# Patient Record
Sex: Female | Born: 1945
Health system: Southern US, Community
[De-identification: ages and names within clinical notes are randomized; demographics above are authoritative.]

## PROBLEM LIST (undated history)

## (undated) ENCOUNTER — Inpatient Hospital Stay: Admission: EM | Payer: Self-pay | Source: Home / Self Care

## (undated) DIAGNOSIS — M109 Gout, unspecified: Secondary | ICD-10-CM

## (undated) DIAGNOSIS — R7303 Prediabetes: Secondary | ICD-10-CM

## (undated) DIAGNOSIS — K52839 Microscopic colitis, unspecified: Secondary | ICD-10-CM

## (undated) DIAGNOSIS — E785 Hyperlipidemia, unspecified: Secondary | ICD-10-CM

## (undated) DIAGNOSIS — I10 Essential (primary) hypertension: Secondary | ICD-10-CM

## (undated) HISTORY — DX: Hyperlipidemia, unspecified: E78.5

## (undated) HISTORY — DX: Microscopic colitis, unspecified: K52.839

## (undated) HISTORY — PX: CHOLECYSTECTOMY: SHX55

## (undated) HISTORY — DX: Prediabetes: R73.03

## (undated) HISTORY — DX: Gout, unspecified: M10.9

## (undated) HISTORY — PX: CYST EXCISION: SHX5701

## (undated) HISTORY — PX: OVARIAN CYST SURGERY: SHX726

## (undated) HISTORY — PX: TONSILLECTOMY: SUR1361

---

## 1997-09-19 ENCOUNTER — Other Ambulatory Visit: Admission: RE | Admit: 1997-09-19 | Discharge: 1997-09-19 | Payer: Self-pay | Admitting: *Deleted

## 2001-04-07 ENCOUNTER — Other Ambulatory Visit: Admission: RE | Admit: 2001-04-07 | Discharge: 2001-04-07 | Payer: Self-pay | Admitting: Internal Medicine

## 2002-03-09 ENCOUNTER — Encounter: Admission: RE | Admit: 2002-03-09 | Discharge: 2002-03-09 | Payer: Self-pay | Admitting: Internal Medicine

## 2002-03-09 ENCOUNTER — Encounter: Payer: Self-pay | Admitting: Internal Medicine

## 2002-04-25 ENCOUNTER — Encounter (INDEPENDENT_AMBULATORY_CARE_PROVIDER_SITE_OTHER): Payer: Self-pay

## 2002-04-25 ENCOUNTER — Ambulatory Visit (HOSPITAL_COMMUNITY): Admission: RE | Admit: 2002-04-25 | Discharge: 2002-04-25 | Payer: Self-pay | Admitting: *Deleted

## 2004-04-12 ENCOUNTER — Other Ambulatory Visit: Admission: RE | Admit: 2004-04-12 | Discharge: 2004-04-12 | Payer: Self-pay | Admitting: Internal Medicine

## 2004-06-06 ENCOUNTER — Emergency Department (HOSPITAL_COMMUNITY): Admission: EM | Admit: 2004-06-06 | Discharge: 2004-06-06 | Payer: Self-pay | Admitting: Emergency Medicine

## 2005-12-22 ENCOUNTER — Encounter (INDEPENDENT_AMBULATORY_CARE_PROVIDER_SITE_OTHER): Payer: Self-pay | Admitting: Specialist

## 2005-12-22 ENCOUNTER — Ambulatory Visit (HOSPITAL_BASED_OUTPATIENT_CLINIC_OR_DEPARTMENT_OTHER): Admission: RE | Admit: 2005-12-22 | Discharge: 2005-12-22 | Payer: Self-pay | Admitting: *Deleted

## 2006-01-03 IMAGING — CR DG HIP COMPLETE 2+V*R*
3 series · 3 of 3 positions shown · non-contrast
Comparison: none

CLINICAL DATA: The patient fell down a flight of stairs this morning and has pain radiating from the right hip to the right ankle. 
 RIGHT HIP ? THREE VIEW:

  There is no evidence of fracture or dislocation. No other significant bone or soft tissue abnormalities are identified. The joint spaces are within normal limits.

[view not recorded (1 of 3)]
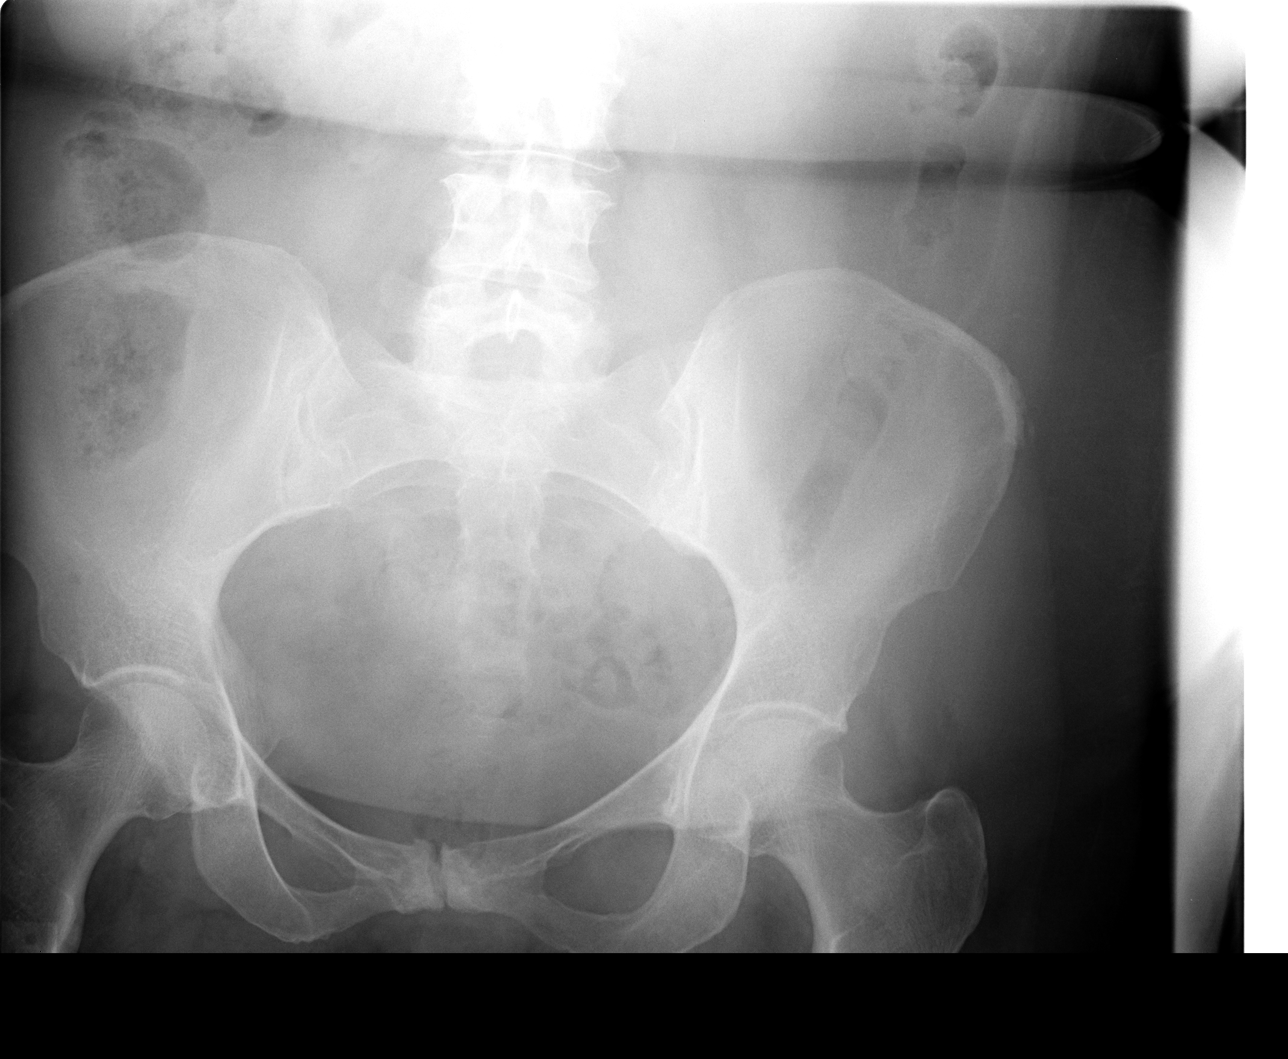

[view not recorded (2 of 3)]
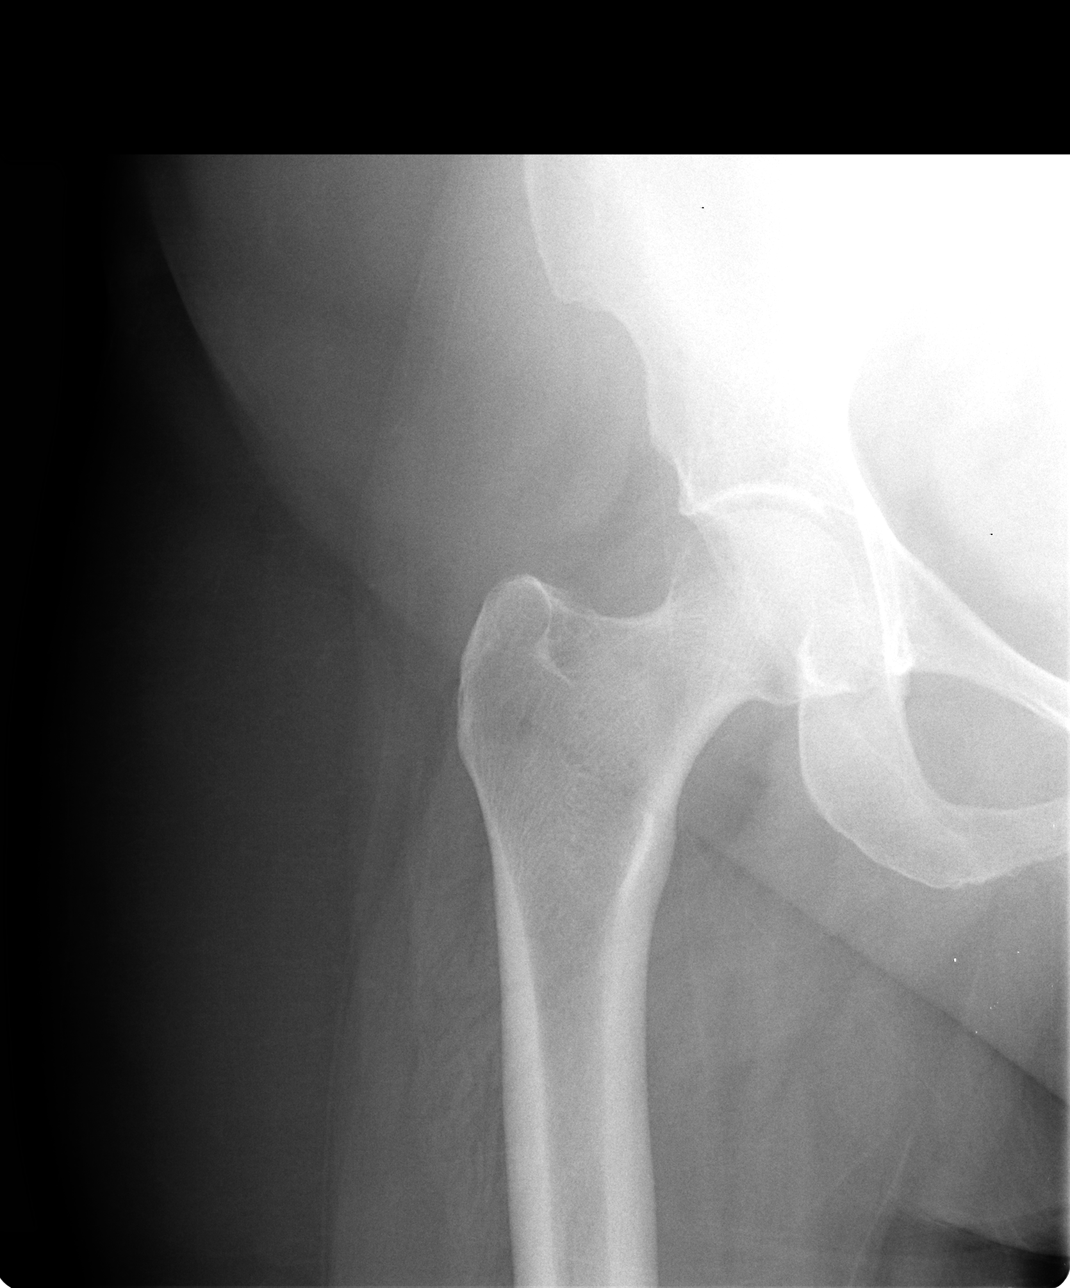

[view not recorded (3 of 3)]
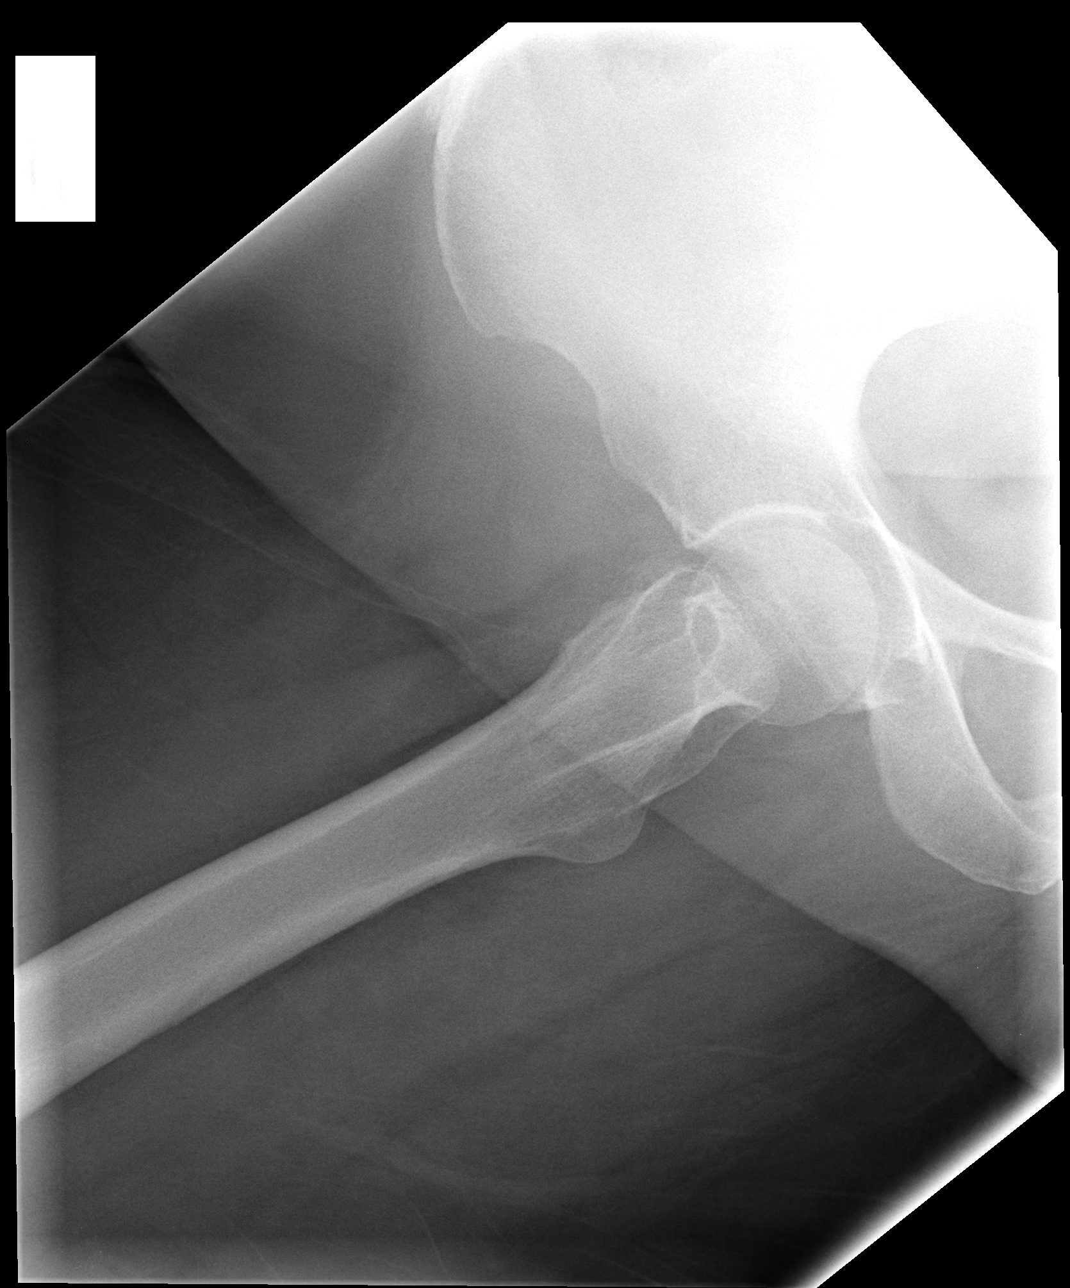

[3 of 3 positions shown; findings below may reference images not displayed]

IMPRESSION: Normal study. 
 RIGHT KNEE ? FOUR VIEW:
 There is no fracture, dislocation, or joint effusion. There is moderate degenerative arthritis of the patellofemoral compartment and to a lesser degree in the medial and lateral compartments. No acute bony abnormality.
IMPRESSION: No acute abnormality.
 RIGHT ANKLE ? THREE VIEW:
 There is no fracture, dislocation, or joint effusion.  No acute bony abnormality.  There are moderate degenerative changes of the ankle joint, particularly at the tip of the medial malleolus where there are some irregular spurs and evidence of old avulsions.
IMPRESSION: No acute abnormality.  Degenerative arthritic changes at the ankle joint.  
 RIGHT TIBIA AND FIBULA:
 There is no fracture, dislocation, or other significant abnormality.
IMPRESSION: Normal right tibia and fibula.

## 2006-01-03 IMAGING — CR DG KNEE COMPLETE 4+V*R*
4 series · 4 of 4 positions shown · non-contrast
Comparison: none

CLINICAL DATA: The patient fell down a flight of stairs this morning and has pain radiating from the right hip to the right ankle. 
 RIGHT HIP ? THREE VIEW:

  There is no evidence of fracture or dislocation. No other significant bone or soft tissue abnormalities are identified. The joint spaces are within normal limits.

[view not recorded (1 of 4)]
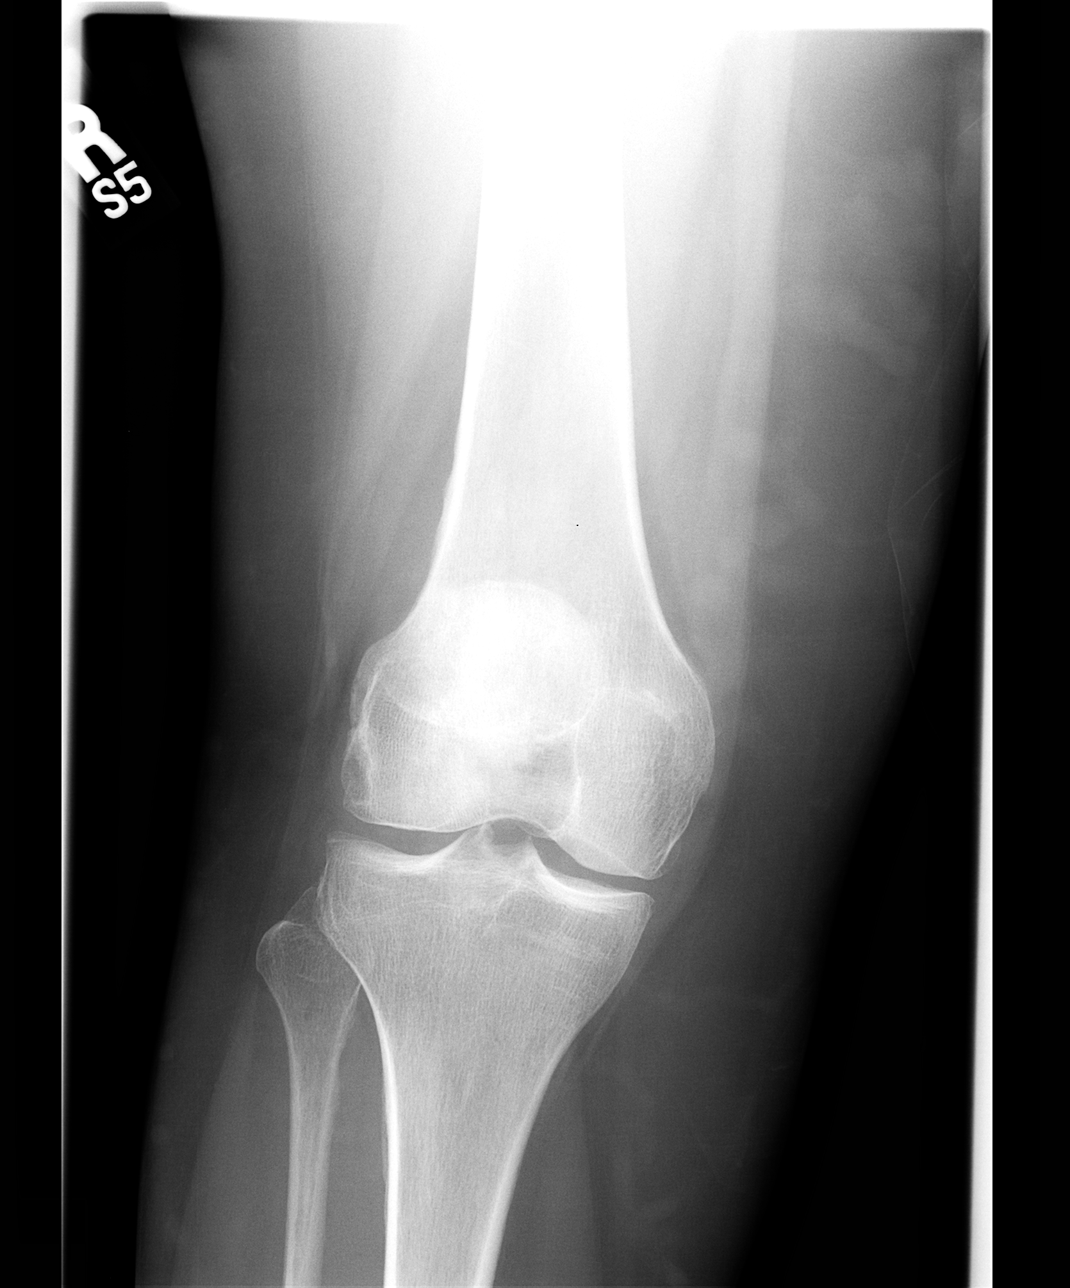

[view not recorded (2 of 4)]
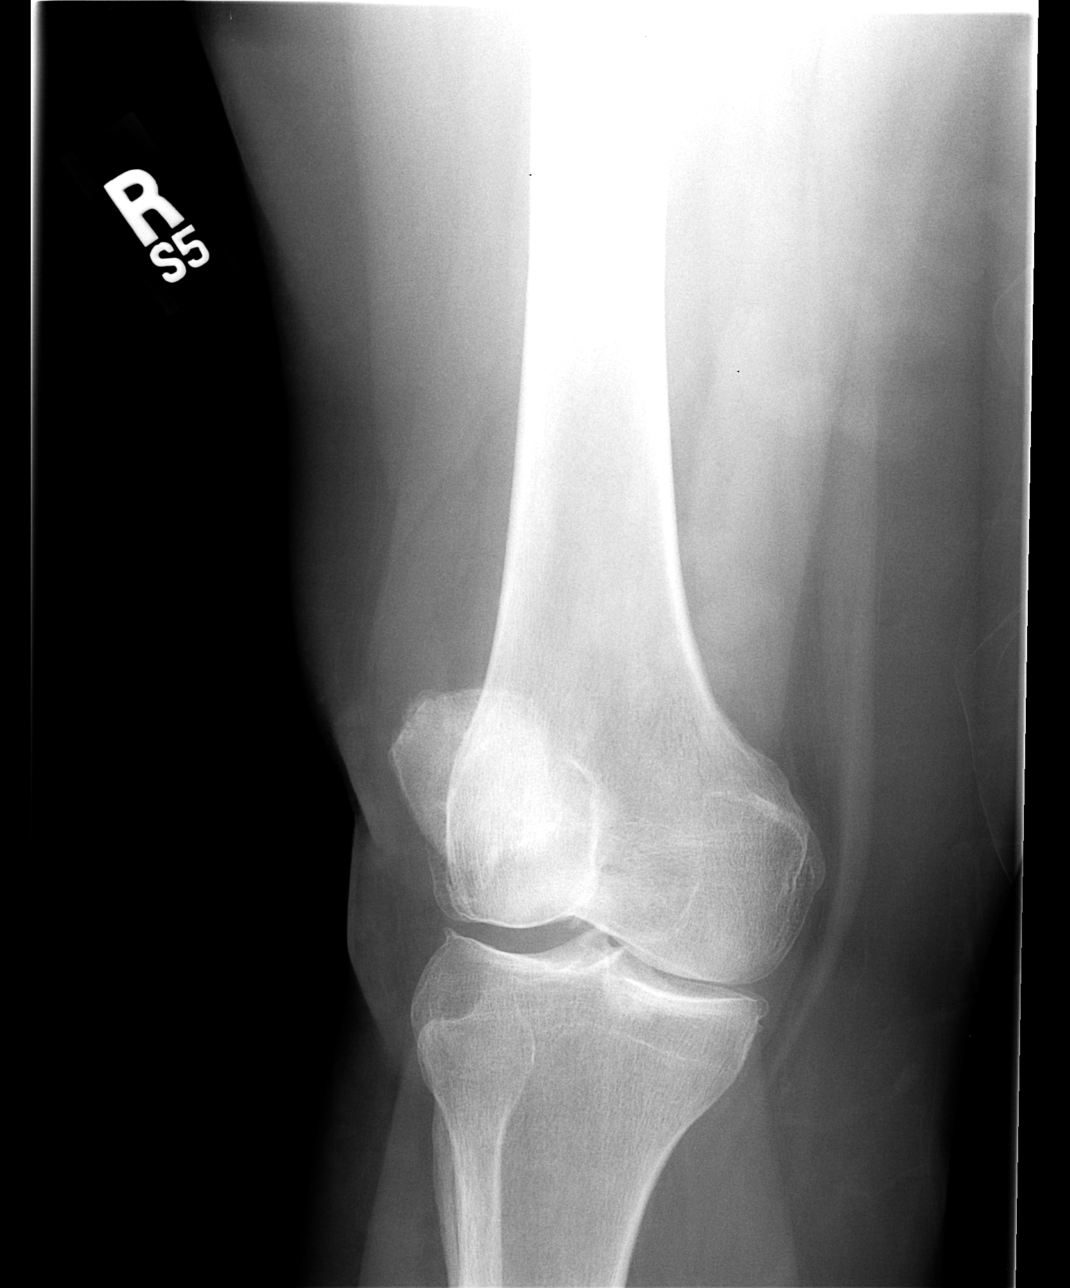

[view not recorded (3 of 4)]
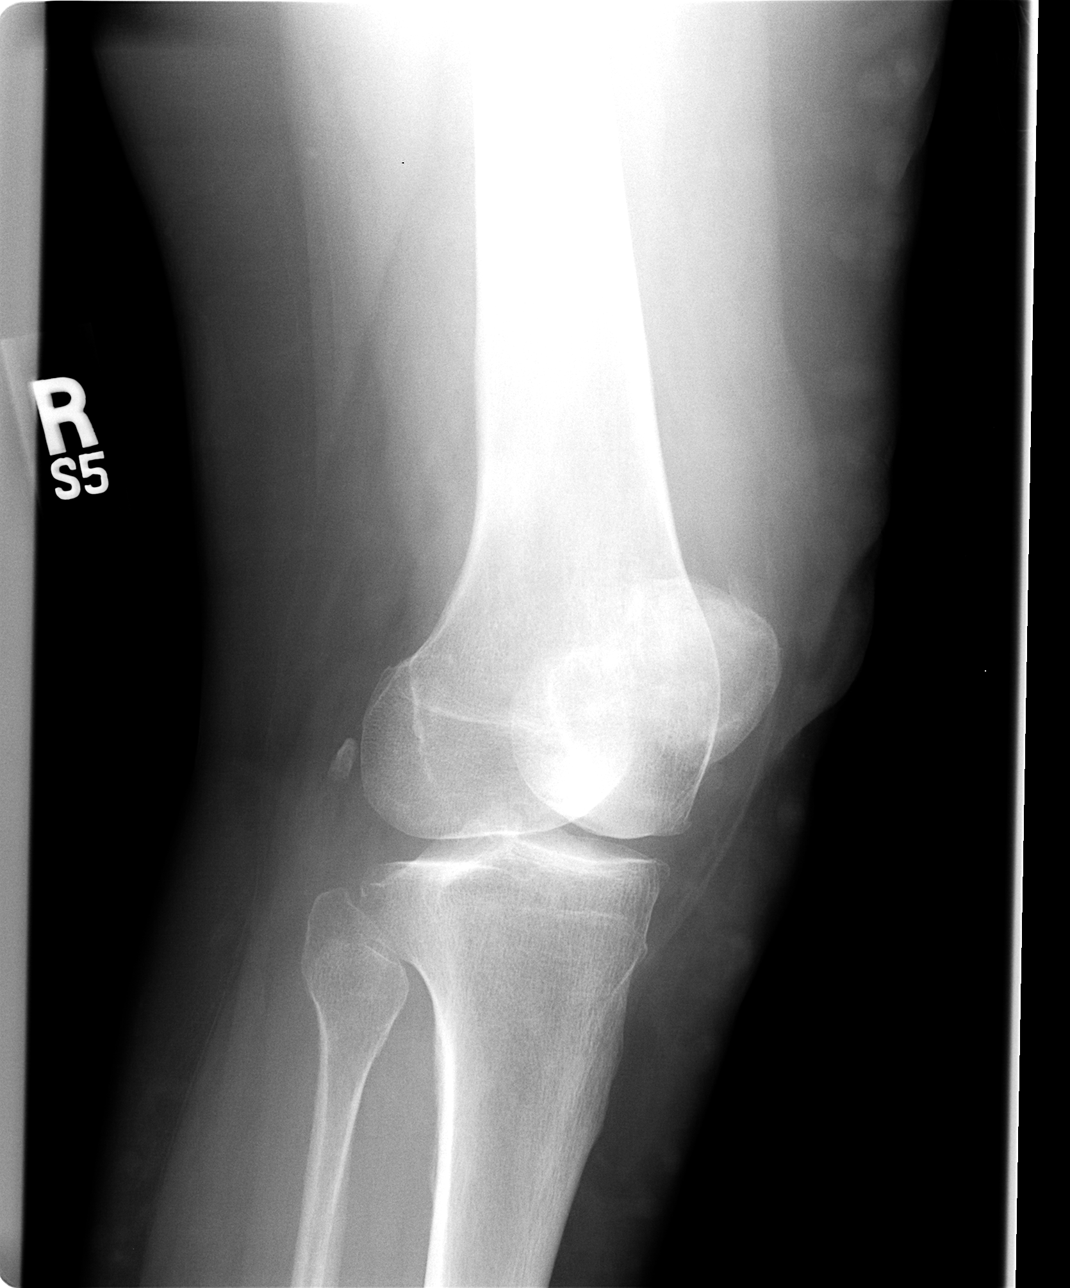

[view not recorded (4 of 4)]
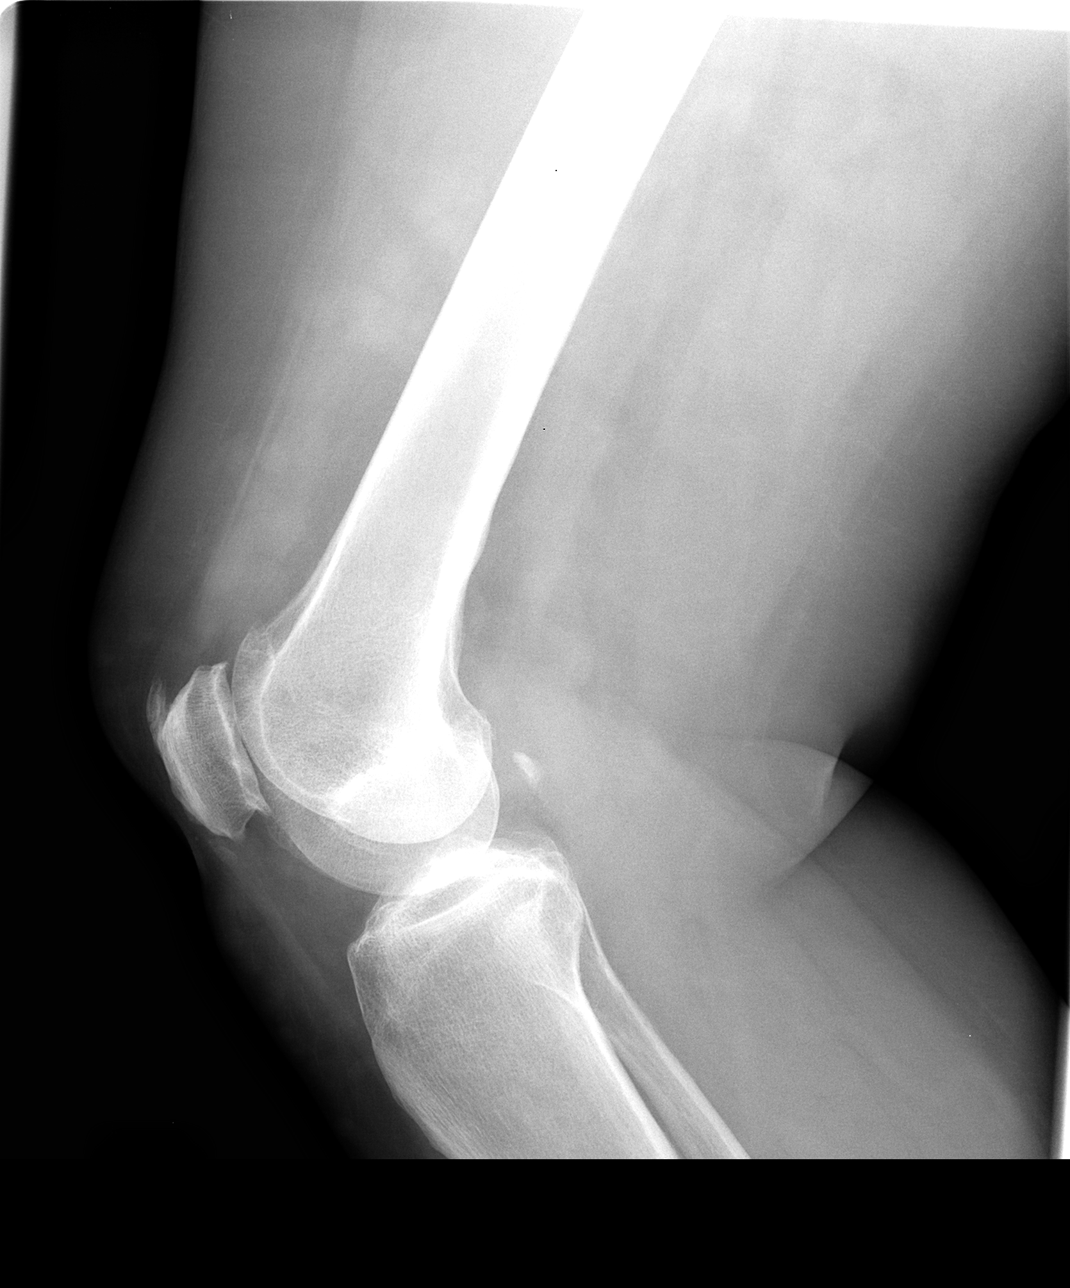

[4 of 4 positions shown; findings below may reference images not displayed]

IMPRESSION: Normal study. 
 RIGHT KNEE ? FOUR VIEW:
 There is no fracture, dislocation, or joint effusion. There is moderate degenerative arthritis of the patellofemoral compartment and to a lesser degree in the medial and lateral compartments. No acute bony abnormality.
IMPRESSION: No acute abnormality.
 RIGHT ANKLE ? THREE VIEW:
 There is no fracture, dislocation, or joint effusion.  No acute bony abnormality.  There are moderate degenerative changes of the ankle joint, particularly at the tip of the medial malleolus where there are some irregular spurs and evidence of old avulsions.
IMPRESSION: No acute abnormality.  Degenerative arthritic changes at the ankle joint.  
 RIGHT TIBIA AND FIBULA:
 There is no fracture, dislocation, or other significant abnormality.
IMPRESSION: Normal right tibia and fibula.

## 2006-01-03 IMAGING — CR DG ANKLE COMPLETE 3+V*R*
2 series · 2 of 2 positions shown · non-contrast
Comparison: none

CLINICAL DATA: The patient fell down a flight of stairs this morning and has pain radiating from the right hip to the right ankle. 
 RIGHT HIP ? THREE VIEW:

  There is no evidence of fracture or dislocation. No other significant bone or soft tissue abnormalities are identified. The joint spaces are within normal limits.

[view not recorded (1 of 2)]
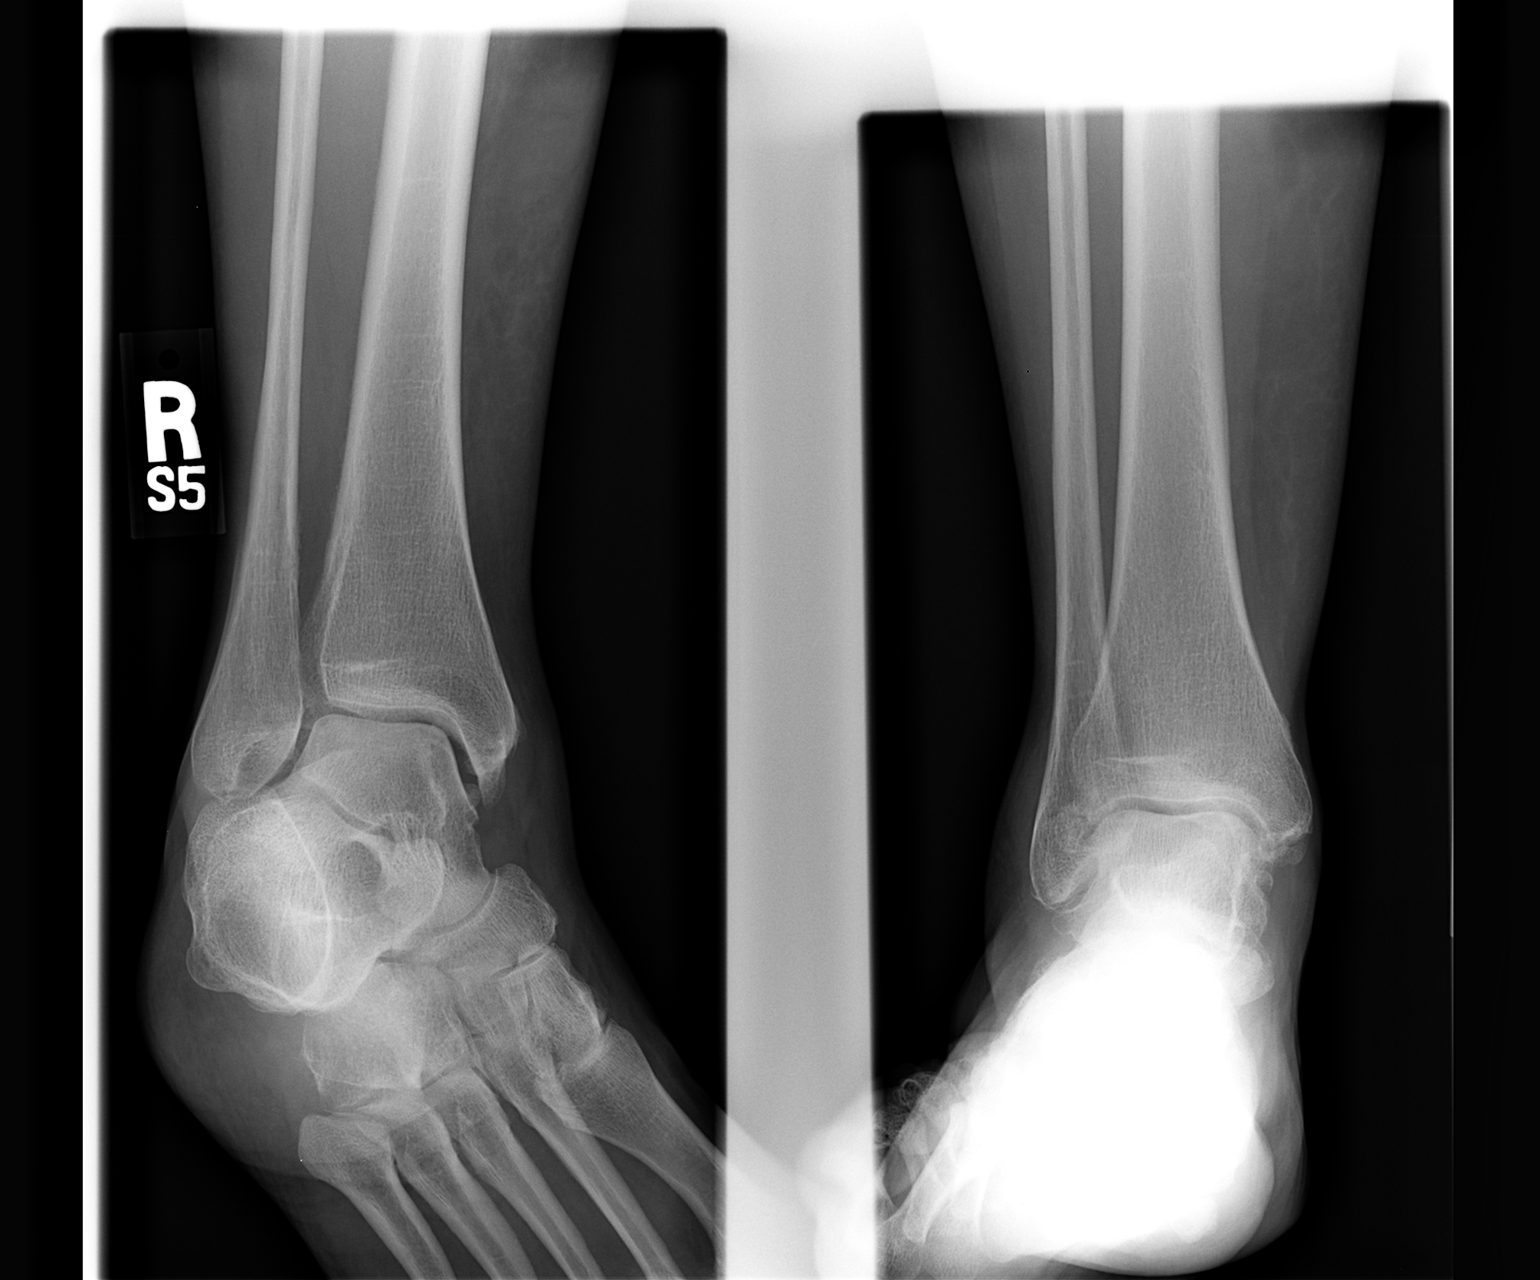

[view not recorded (2 of 2)]
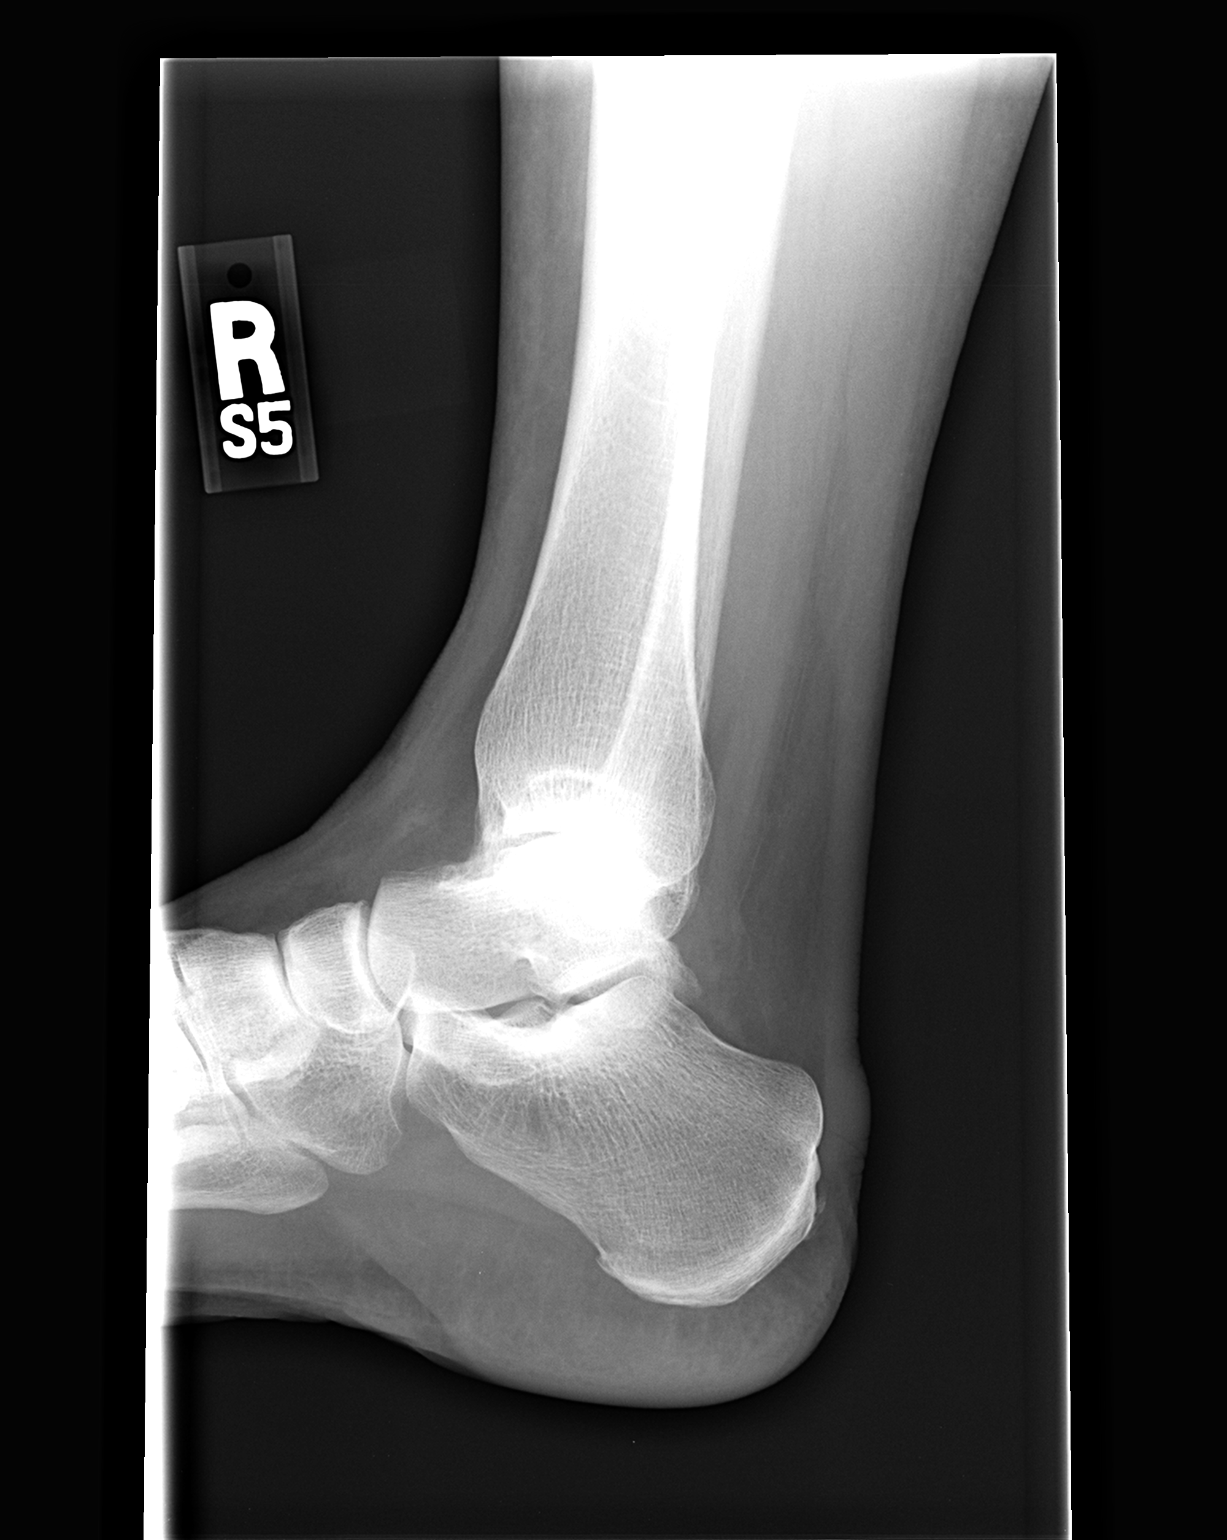

[2 of 2 positions shown; findings below may reference images not displayed]

IMPRESSION: Normal study. 
 RIGHT KNEE ? FOUR VIEW:
 There is no fracture, dislocation, or joint effusion. There is moderate degenerative arthritis of the patellofemoral compartment and to a lesser degree in the medial and lateral compartments. No acute bony abnormality.
IMPRESSION: No acute abnormality.
 RIGHT ANKLE ? THREE VIEW:
 There is no fracture, dislocation, or joint effusion.  No acute bony abnormality.  There are moderate degenerative changes of the ankle joint, particularly at the tip of the medial malleolus where there are some irregular spurs and evidence of old avulsions.
IMPRESSION: No acute abnormality.  Degenerative arthritic changes at the ankle joint.  
 RIGHT TIBIA AND FIBULA:
 There is no fracture, dislocation, or other significant abnormality.
IMPRESSION: Normal right tibia and fibula.

## 2006-07-20 ENCOUNTER — Encounter: Admission: RE | Admit: 2006-07-20 | Discharge: 2006-07-20 | Payer: Self-pay | Admitting: Occupational Medicine

## 2010-02-08 ENCOUNTER — Encounter: Admission: RE | Admit: 2010-02-08 | Discharge: 2010-02-08 | Payer: Self-pay | Admitting: Internal Medicine

## 2010-04-05 ENCOUNTER — Ambulatory Visit (HOSPITAL_COMMUNITY): Admission: RE | Admit: 2010-04-05 | Discharge: 2010-04-05 | Payer: Self-pay | Admitting: Surgery

## 2010-09-05 LAB — COMPREHENSIVE METABOLIC PANEL
ALT: 16 U/L (ref 0–35)
AST: 19 U/L (ref 0–37)
Albumin: 4.1 g/dL (ref 3.5–5.2)
Alkaline Phosphatase: 63 U/L (ref 39–117)
BUN: 20 mg/dL (ref 6–23)
CO2: 27 mEq/L (ref 19–32)
Calcium: 9.1 mg/dL (ref 8.4–10.5)
Chloride: 104 mEq/L (ref 96–112)
Creatinine, Ser: 0.8 mg/dL (ref 0.4–1.2)
GFR calc Af Amer: >60 mL/min (ref >60)
GFR calc non Af Amer: >60 mL/min (ref >60)
Glucose, Bld: 100 mg/dL — ABNORMAL HIGH (ref 70–99)
Potassium: 3.7 mEq/L (ref 3.5–5.1)
Sodium: 137 mEq/L (ref 135–145)
Total Bilirubin: 0.6 mg/dL (ref 0.3–1.2)
Total Protein: 6.9 g/dL (ref 6.0–8.3)

## 2010-09-05 LAB — CBC
HCT: 39.5 % (ref 36.0–46.0)
Hemoglobin: 13.6 g/dL (ref 12.0–15.0)
MCH: 30.8 pg (ref 26.0–34.0)
MCHC: 34.5 g/dL (ref 30.0–36.0)
MCV: 89.2 fL (ref 78.0–100.0)
Platelets: 243 10*3/uL (ref 150–400)
RBC: 4.43 MIL/uL (ref 3.87–5.11)
RDW: 13.7 % (ref 11.5–15.5)
WBC: 6.9 10*3/uL (ref 4.0–10.5)

## 2010-09-05 LAB — SURGICAL PCR SCREEN
MRSA, PCR: NEGATIVE
Staphylococcus aureus: POSITIVE — AB

## 2010-11-08 NOTE — Op Note (Signed)
Shelly Yates, Shelly Yates                  ACCOUNT NO.:  [id number]   MEDICAL RECORD NO.:  [id number]          PATIENT TYPE:  AMB   LOCATION:  [facility]                         FACILITY:  [facility]   PHYSICIAN:  Alfonse Ras, MD   DATE OF BIRTH:  1945-09-14   DATE OF PROCEDURE:  12/22/2005  DATE OF DISCHARGE:                                 OPERATIVE REPORT   PREOPERATIVE DIAGNOSIS:  Right chest lipoma.   POSTOPERATIVE DIAGNOSIS:  Right chest lipoma.   PROCEDURE:  Excision of right chest wall lipoma.   ANESTHESIA:  MAC.   SURGEON:  Alfonse Ras, MD   DESCRIPTION OF PROCEDURE:  The patient was taken to the operating room and  placed in the supine position.  After adequate MAC anesthesia was induced,  she was placed in the left lateral decubitus position.  The right arm was  extended superiorly.  The area of the palpable lipoma was anesthetized after  the area had been prepped and draped in the normal sterile fashion with 1%  lidocaine with bicarb.   A transverse incision was made over the palpable mass and dissected down  through the subcutaneous fat onto a well-encapsulated lipoma which easily  delivered itself up through the wound.  It was excised in its entirety.  Adequate hemostasis was assured.  The skin was closed with subcuticular 3-0  Monocryl.  Steri-Strips and sterile dressings were applied.   The patient tolerated the procedure well and went to the PACU in good  condition.      Alfonse Ras, MD  Electronically Signed     KRE/MEDQ  D:  12/22/2005  T:  12/22/2005  Job:  [id number]

## 2010-11-08 NOTE — Op Note (Signed)
   NAME:  Shelly Yates, Shelly Yates                            ACCOUNT NO.:  [id number]   MEDICAL RECORD NO.:  [id number]                   PATIENT TYPE:  AMB   LOCATION:  ENDO                                 FACILITY:  [facility]   PHYSICIAN:  Georgiana Spinner, M.D.                 DATE OF BIRTH:  [birth year]   DATE OF PROCEDURE:  DATE OF DISCHARGE:                                 OPERATIVE REPORT   PROCEDURE:  Colonoscopy.   INDICATIONS:  Rectal bleeding.   ANESTHESIA:  Demerol 20, Versed 1.5 mg.   DESCRIPTION OF PROCEDURE:  With the patient mildly sedated in the left  lateral decubitus position, the Olympus videoscopic colonoscope was inserted  in the rectum and passed under direct vision to the cecum, identified by the  ileocecal valve and appendiceal orifice, both of which were photographed.  From this point, the colonoscope was slowly withdrawn, taking  circumferential views of the entire colonic mucosa, stopping to photograph  mild to moderate diverticulosis seen in the sigmoid colon, until we reached  the rectum. which appeared normal on direct and showed hemorrhoids on  retroflexed view.  The endoscope was straightened and withdrawn.  The  patient's vital signs and pulse oximeter remained stable.  The patient  tolerated the procedure well without apparent complications.   FINDINGS:  1. Internal hemorrhoids.  2. Diverticulosis of the sigmoid colon.  3. Otherwise unremarkable exam.   PLAN:  See endoscopy note for further details.                                               Georgiana Spinner, M.D.    GMO/MEDQ  D:  04/25/2002  T:  04/25/2002  Job:  [id number]

## 2010-11-08 NOTE — Op Note (Signed)
   NAMELUNABELLA, BADGETT                              ACCOUNT NO.:  [id number]   MEDICAL RECORD NO.:  [id number]                   PATIENT TYPE:   LOCATION:                                       FACILITY:   PHYSICIAN:  Georgiana Spinner, M.D.                 DATE OF BIRTH:   DATE OF PROCEDURE:  DATE OF DISCHARGE:                                 OPERATIVE REPORT   PROCEDURE:  Upper endoscopy.   INDICATIONS:  Abdominal pain, reflux.   ANESTHESIA:  Versed 7 mg, Demerol 55 mg.   DESCRIPTION OF PROCEDURE:  With patient mildly sedated in the left lateral  decubitus position, the Olympus videoscopic endoscope was inserted in the  mouth, passed under direct vision through the esophagus, which appeared  normal, into the stomach.  Fundus, body appeared normal.  Antrum showed some  very mild antritis which was photographed and biopsied.  We entered the  duodenal bulb which showed mild duodenitis.  Second portion of  duodenum  appeared normal.  From this point, the endoscope was slowly withdrawn,  taking circumferential views of the duodenal mucosa until the endoscope then  pulled back into the stomach, placed in retroflexion to view the stomach  from below.  The endoscope was then straightened and withdrawn, taking  circumferential views of the remaining gastric and esophageal mucosa.  The  patient's vital signs and pulse oximeter remained stable.  The patient  tolerated the procedure well without apparent complications.   FINDINGS:  Changes as mentioned above of antritis, mild, and duodenitis,  mild.   PLAN:  1. Await biopsy report.  2. The patient will call me for results and follow up with me as an     outpatient.  3. Proceed to colonoscopy as planned.                                               Georgiana Spinner, M.D.    GMO/MEDQ  D:  04/25/2002  T:  04/25/2002  Job:  [id number]

## 2012-03-30 ENCOUNTER — Other Ambulatory Visit (HOSPITAL_COMMUNITY)
Admission: RE | Admit: 2012-03-30 | Discharge: 2012-03-30 | Disposition: A | Discharge status: Home / Self Care | Payer: BC Managed Care – PPO | Source: Ambulatory Visit | Attending: Internal Medicine | Admitting: Internal Medicine

## 2012-03-30 ENCOUNTER — Other Ambulatory Visit: Payer: Self-pay | Admitting: Registered Nurse

## 2012-03-30 DIAGNOSIS — Z124 Encounter for screening for malignant neoplasm of cervix: Secondary | ICD-10-CM | POA: Insufficient documentation

## 2012-07-03 ENCOUNTER — Emergency Department (HOSPITAL_COMMUNITY)
Admission: EM | Admit: 2012-07-03 | Discharge: 2012-07-03 | Disposition: A | Discharge status: Home / Self Care | Payer: BC Managed Care – PPO | Attending: Emergency Medicine | Admitting: Emergency Medicine

## 2012-07-03 ENCOUNTER — Emergency Department (HOSPITAL_COMMUNITY): Payer: BC Managed Care – PPO

## 2012-07-03 ENCOUNTER — Encounter (HOSPITAL_COMMUNITY): Payer: Self-pay | Admitting: Emergency Medicine

## 2012-07-03 DIAGNOSIS — I10 Essential (primary) hypertension: Secondary | ICD-10-CM | POA: Insufficient documentation

## 2012-07-03 DIAGNOSIS — K5732 Diverticulitis of large intestine without perforation or abscess without bleeding: Secondary | ICD-10-CM | POA: Insufficient documentation

## 2012-07-03 DIAGNOSIS — Z79899 Other long term (current) drug therapy: Secondary | ICD-10-CM | POA: Insufficient documentation

## 2012-07-03 DIAGNOSIS — Z7982 Long term (current) use of aspirin: Secondary | ICD-10-CM | POA: Insufficient documentation

## 2012-07-03 DIAGNOSIS — K5792 Diverticulitis of intestine, part unspecified, without perforation or abscess without bleeding: Secondary | ICD-10-CM

## 2012-07-03 HISTORY — DX: Essential (primary) hypertension: I10

## 2012-07-03 LAB — URINALYSIS, MICROSCOPIC ONLY
Bilirubin Urine: NEGATIVE
Hgb urine dipstick: NEGATIVE
Ketones, ur: NEGATIVE mg/dL
Leukocytes, UA: NEGATIVE
Nitrite: NEGATIVE
Protein, ur: NEGATIVE mg/dL
Specific Gravity, Urine: 1.014 (ref 1.005–1.030)
Urobilinogen, UA: 0.2 mg/dL (ref 0.0–1.0)
pH: 6 (ref 5.0–8.0)

## 2012-07-03 LAB — COMPREHENSIVE METABOLIC PANEL
ALT: 15 U/L (ref 0–35)
AST: 16 U/L (ref 0–37)
Alkaline Phosphatase: 77 U/L (ref 39–117)
BUN: 15 mg/dL (ref 6–23)
CO2: 27 mEq/L (ref 19–32)
Calcium: 10.2 mg/dL (ref 8.4–10.5)
Chloride: 96 mEq/L (ref 96–112)
Creatinine, Ser: 0.67 mg/dL (ref 0.50–1.10)
GFR calc Af Amer: >90 mL/min (ref >90)
GFR calc non Af Amer: >90 mL/min (ref >90)
Glucose, Bld: 111 mg/dL — ABNORMAL HIGH (ref 70–99)
Potassium: 3.8 mEq/L (ref 3.5–5.1)
Total Bilirubin: 0.5 mg/dL (ref 0.3–1.2)
Total Protein: 7.8 g/dL (ref 6.0–8.3)

## 2012-07-03 LAB — CBC WITH DIFFERENTIAL/PLATELET
Basophils Relative: 0 % (ref 0–1)
Eosinophils Absolute: 0.1 10*3/uL (ref 0.0–0.7)
Eosinophils Relative: 1 % (ref 0–5)
HCT: 42 % (ref 36.0–46.0)
Hemoglobin: 14.7 g/dL (ref 12.0–15.0)
Lymphocytes Relative: 19 % (ref 12–46)
Lymphs Abs: 2.1 10*3/uL (ref 0.7–4.0)
MCH: 30.4 pg (ref 26.0–34.0)
MCHC: 35 g/dL (ref 30.0–36.0)
MCV: 87 fL (ref 78.0–100.0)
Monocytes Absolute: 0.5 10*3/uL (ref 0.1–1.0)
Monocytes Relative: 5 % (ref 3–12)
Neutro Abs: 8.3 10*3/uL — ABNORMAL HIGH (ref 1.7–7.7)
Neutrophils Relative %: 76 % (ref 43–77)
Platelets: 296 10*3/uL (ref 150–400)
RBC: 4.83 MIL/uL (ref 3.87–5.11)
RDW: 13 % (ref 11.5–15.5)
WBC: 11 10*3/uL — ABNORMAL HIGH (ref 4.0–10.5)

## 2012-07-03 LAB — LIPASE, BLOOD: Lipase: 32 U/L (ref 11–59)

## 2012-07-03 MED ORDER — ONDANSETRON HCL 8 MG PO TABS: 8.0000 mg | ORAL_TABLET | Freq: Three times a day (TID) | ORAL | Status: DC | PRN

## 2012-07-03 MED ORDER — HYDROCODONE-ACETAMINOPHEN 5-325 MG PO TABS: 1.0000 | ORAL_TABLET | Freq: Four times a day (QID) | ORAL | Status: DC | PRN

## 2012-07-03 MED ORDER — METRONIDAZOLE 500 MG PO TABS: 500.0000 mg | ORAL_TABLET | Freq: Four times a day (QID) | ORAL | Status: DC

## 2012-07-03 MED ORDER — ONDANSETRON HCL 4 MG/2ML IJ SOLN
4.0000 mg | Freq: Once | INTRAMUSCULAR | Status: AC
  Administered 2012-07-03: 4 mg via INTRAVENOUS
  Filled 2012-07-03: qty 2

## 2012-07-03 MED ORDER — CIPROFLOXACIN HCL 500 MG PO TABS: 500.0000 mg | ORAL_TABLET | Freq: Two times a day (BID) | ORAL | Status: DC

## 2012-07-03 MED ORDER — HYDROMORPHONE HCL PF 1 MG/ML IJ SOLN
1.0000 mg | Freq: Once | INTRAMUSCULAR | Status: AC
  Administered 2012-07-03: 1 mg via INTRAVENOUS
  Filled 2012-07-03: qty 1

## 2012-07-03 MED ORDER — CIPROFLOXACIN IN D5W 400 MG/200ML IV SOLN
400.0000 mg | Freq: Once | INTRAVENOUS | Status: AC
  Administered 2012-07-03: 400 mg via INTRAVENOUS
  Filled 2012-07-03: qty 200

## 2012-07-03 MED ORDER — IOHEXOL 300 MG/ML  SOLN
100.0000 mL | Freq: Once | INTRAMUSCULAR | Status: AC | PRN
  Administered 2012-07-03: 100 mL via INTRAVENOUS

## 2012-07-03 MED ORDER — METRONIDAZOLE IN NACL 5-0.79 MG/ML-% IV SOLN
500.0000 mg | Freq: Once | INTRAVENOUS | Status: AC
  Administered 2012-07-03: 500 mg via INTRAVENOUS
  Filled 2012-07-03: qty 100

## 2012-07-03 MED ORDER — SODIUM CHLORIDE 0.9 % IV BOLUS (SEPSIS)
500.0000 mL | Freq: Once | INTRAVENOUS | Status: AC
  Administered 2012-07-03: 500 mL via INTRAVENOUS

## 2012-07-03 NOTE — ED Provider Notes (Signed)
History     CSN: [id number]  Arrival date & time 07/03/12  1432   First MD Initiated Contact with Patient 07/03/12 1828      Chief Complaint  Patient presents with  . Abdominal Pain    (Consider location/radiation/quality/duration/timing/severity/associated sxs/prior treatment) Patient is a 67 y.o. female presenting with abdominal pain. The history is provided by the patient.  Abdominal Pain The primary symptoms of the illness include abdominal pain. The primary symptoms of the illness do not include fever, shortness of breath, vomiting or dysuria.  Symptoms associated with the illness do not include chills, hematuria or back pain.  pt c/o lower abd pain, esp left, constant since this morning. Dull, moderate. Non radiating. No specific exacerbating or alleviating factors. No fever or chills. Nausea. Decreased appetite. No vomiting. Had normal bm today. No recent constipation. No vaginal discharge or bleeding. No dysuria or gu c/o. No back or flank pain. Denies hx diverticula. Prior abd surgery includes hysterectomy and cholecystectomy.     Past Medical History  Diagnosis Date  . Hypertension     Past Surgical History  Procedure Date  . Cholecystectomy     History reviewed. No pertinent family history.  History  Substance Use Topics  . Smoking status: Never Smoker   . Smokeless tobacco: Not on file  . Alcohol Use: No    OB History    Grav Para Term Preterm Abortions TAB SAB Ect Mult Living                  Review of Systems  Constitutional: Negative for fever and chills.  HENT: Negative for neck pain.   Eyes: Negative for redness.  Respiratory: Negative for cough and shortness of breath.   Cardiovascular: Negative for chest pain.  Gastrointestinal: Positive for abdominal pain. Negative for vomiting.  Genitourinary: Negative for dysuria, hematuria and flank pain.  Musculoskeletal: Negative for back pain.  Skin: Negative for rash.  Neurological: Negative for  headaches.  Hematological: Does not bruise/bleed easily.  Psychiatric/Behavioral: Negative for confusion.    Allergies  Codeine and Naproxen  Home Medications   Current Outpatient Rx  Name  Route  Sig  Dispense  Refill  . ASPIRIN EC 81 MG PO TBEC   Oral   Take 81 mg by mouth daily.         Marland Kitchen VITAMIN D 1000 UNITS PO TABS   Oral   Take 4,000 Units by mouth daily.         Marland Kitchen COENZYME Q10 30 MG PO CAPS   Oral   Take 30 mg by mouth daily.         Marland Kitchen LEVOTHYROXINE SODIUM 50 MCG PO TABS   Oral   Take 50 mcg by mouth every morning.         Marland Kitchen OLMESARTAN MEDOXOMIL 20 MG PO TABS   Oral   Take 20 mg by mouth every morning.         Marland Kitchen SIMVASTATIN 20 MG PO TABS   Oral   Take 20 mg by mouth every evening.         . TRIAMTERENE-HCTZ 37.5-25 MG PO TABS   Oral   Take 1 tablet by mouth every morning.           BP 136/71  Pulse 76  Temp 99 F (37.2 C) (Oral)  Resp 18  SpO2 100%  Physical Exam  Nursing note and vitals reviewed. Constitutional: She appears well-developed and well-nourished. No distress.  HENT:  Mouth/Throat: Oropharynx is clear and moist.  Eyes: Conjunctivae normal are normal. No scleral icterus.  Neck: Neck supple. No tracheal deviation present.  Cardiovascular: Normal rate, regular rhythm, normal heart sounds and intact distal pulses.  Exam reveals no gallop and no friction rub.   No murmur heard. Pulmonary/Chest: Effort normal and breath sounds normal. No respiratory distress.  Abdominal: Soft. Normal appearance and bowel sounds are normal. She exhibits no distension and no mass. There is tenderness. There is no rebound and no guarding.       Left lower abd tenderness. No rebound or guarding. No puls mass. No incarc hernia.   Genitourinary:       No cva tenderness  Musculoskeletal: She exhibits no edema.  Neurological: She is alert.  Skin: Skin is warm and dry. No rash noted.       No rash/shingles noted in area of pain  Psychiatric: She has  a normal mood and affect.    ED Course  Procedures (including critical care time)  Labs Reviewed  CBC WITH DIFFERENTIAL - Abnormal; Notable for the following:    WBC 11.0 (*)     Neutro Abs 8.3 (*)     All other components within normal limits  COMPREHENSIVE METABOLIC PANEL - Abnormal; Notable for the following:    Glucose, Bld 111 (*)     All other components within normal limits  LIPASE, BLOOD  URINALYSIS, MICROSCOPIC ONLY   Results for orders placed during the hospital encounter of 07/03/12  CBC WITH DIFFERENTIAL      Component Value Range   WBC 11.0 (*) 4.0 - 10.5 K/uL   RBC 4.83  3.87 - 5.11 MIL/uL   Hemoglobin 14.7  12.0 - 15.0 g/dL   HCT 56.2  13.0 - 86.5 %   MCV 87.0  78.0 - 100.0 fL   MCH 30.4  26.0 - 34.0 pg   MCHC 35.0  30.0 - 36.0 g/dL   RDW 78.4  69.6 - 29.5 %   Platelets 296  150 - 400 K/uL   Neutrophils Relative 76  43 - 77 %   Neutro Abs 8.3 (*) 1.7 - 7.7 K/uL   Lymphocytes Relative 19  12 - 46 %   Lymphs Abs 2.1  0.7 - 4.0 K/uL   Monocytes Relative 5  3 - 12 %   Monocytes Absolute 0.5  0.1 - 1.0 K/uL   Eosinophils Relative 1  0 - 5 %   Eosinophils Absolute 0.1  0.0 - 0.7 K/uL   Basophils Relative 0  0 - 1 %   Basophils Absolute 0.0  0.0 - 0.1 K/uL  COMPREHENSIVE METABOLIC PANEL      Component Value Range   Sodium 135  135 - 145 mEq/L   Potassium 3.8  3.5 - 5.1 mEq/L   Chloride 96  96 - 112 mEq/L   CO2 27  19 - 32 mEq/L   Glucose, Bld 111 (*) 70 - 99 mg/dL   BUN 15  6 - 23 mg/dL   Creatinine, Ser 2.84  0.50 - 1.10 mg/dL   Calcium 13.2  8.4 - 44.0 mg/dL   Total Protein 7.8  6.0 - 8.3 g/dL   Albumin 4.3  3.5 - 5.2 g/dL   AST 16  0 - 37 U/L   ALT 15  0 - 35 U/L   Alkaline Phosphatase 77  39 - 117 U/L   Total Bilirubin 0.5  0.3 - 1.2 mg/dL   GFR calc non Af Amer >  90  >90 mL/min   GFR calc Af Amer >90  >90 mL/min  LIPASE, BLOOD      Component Value Range   Lipase 32  11 - 59 U/L  URINALYSIS, MICROSCOPIC ONLY      Component Value Range   Color,  Urine YELLOW  YELLOW   APPearance CLEAR  CLEAR   Specific Gravity, Urine 1.014  1.005 - 1.030   pH 6.0  5.0 - 8.0   Glucose, UA NEGATIVE  NEGATIVE mg/dL   Hgb urine dipstick NEGATIVE  NEGATIVE   Bilirubin Urine NEGATIVE  NEGATIVE   Ketones, ur NEGATIVE  NEGATIVE mg/dL   Protein, ur NEGATIVE  NEGATIVE mg/dL   Urobilinogen, UA 0.2  0.0 - 1.0 mg/dL   Nitrite NEGATIVE  NEGATIVE   Leukocytes, UA NEGATIVE  NEGATIVE   Bacteria, UA RARE  RARE   Squamous Epithelial / LPF RARE  RARE   Ct Abdomen Pelvis W Contrast  07/03/2012  *RADIOLOGY REPORT*  Clinical Data: Abdominal pain.  Lower abdominal pain., leukocytosis  CT ABDOMEN AND PELVIS WITH CONTRAST  Technique:  Multidetector CT imaging of the abdomen and pelvis was performed following the standard protocol during bolus administration of intravenous contrast.  Contrast: OMNIPAQUE IOHEXOL 300 MG/ML  SOLN  Comparison: None.  Findings: Linear pleural parenchymal thickening at the left lung base appears benign.  No pericardial fluid.  There is mild biliary ductal  dilatation and common bile duct dilatation.  This is likely secondary to cholecystectomy.  There is a simple hepatic cyst in the left hepatic lobe and posterior right hepatic lobe.  The pancreas, spleen, adrenal glands, and kidneys are normal.  The stomach, small bowel, cecum, appendix are normal.  Multiple diverticula of the descending colon and sigmoid colon.  Beginning in the proximal sigmoid colon there is pericolonic stranding within the mesenteric fat.  This is consistent with  acute diverticulitis. No evidence of macro perforation or abscess formation.  Abdominal aorta normal caliber.  No retroperitoneal periportal lymphadenopathy.  No free fluid the pelvis.  The uterus and ovaries and bladder normal.  No pelvic lymphadenopathy. Review of  bone windows demonstrates no aggressive osseous lesions.  IMPRESSION:  1.  Acute non complicated diverticulitis of the sigmoid colon. 2.  Mild intrahepatic  and extrahepatic biliary duct dilatation is likely related to cholecystectomy.  Recommend correlation with liver function enzymes and if elevated consider MRCP. 3.  Benign hepatic cysts.   Original Report Authenticated By: Genevive Bi, M.D.        MDM  Iv ns. Dilaudid iv. zofran iv.   Persistent tenderness. Will get ct.  Reviewed nursing notes and prior charts for additional history.    cipro and flagyl iv.  Recheck no change, mild llq tenderness. Pain improved from prior.   Discussed ct w pt.    Pt tolerating po. Comfortable appearing. Stable for dc    Suzi Roots, MD 07/03/12 2228

## 2012-07-03 NOTE — ED Notes (Signed)
MD at bedside. 

## 2012-07-03 NOTE — ED Notes (Signed)
Pt states that she woke up this morning thinking she had gas pains.  States that the pain she is having is in her low abd and feels like "labor pains".  Denies vomiting or diarrhea.  Pain has "calmed down".  Pt states that her pain is a 3 or 4 now.

## 2012-07-03 NOTE — ED Notes (Signed)
Reports 2 days of lower abdominal pain described as cramping coming & going accompanied with nausea. Worse pain rated 8/10 now 5/10.

## 2013-11-02 ENCOUNTER — Ambulatory Visit: Payer: Self-pay | Admitting: Podiatrist

## 2013-11-09 ENCOUNTER — Ambulatory Visit (INDEPENDENT_AMBULATORY_CARE_PROVIDER_SITE_OTHER): Payer: 59 | Admitting: Podiatrist

## 2013-11-09 ENCOUNTER — Encounter: Payer: Self-pay | Admitting: Podiatrist

## 2013-11-09 ENCOUNTER — Ambulatory Visit (INDEPENDENT_AMBULATORY_CARE_PROVIDER_SITE_OTHER): Payer: 59

## 2013-11-09 DIAGNOSIS — M21619 Bunion of unspecified foot: Secondary | ICD-10-CM

## 2013-11-09 DIAGNOSIS — M62838 Other muscle spasm: Secondary | ICD-10-CM

## 2013-11-09 DIAGNOSIS — M722 Plantar fascial fibromatosis: Secondary | ICD-10-CM

## 2013-11-09 DIAGNOSIS — R52 Pain, unspecified: Secondary | ICD-10-CM

## 2013-11-09 DIAGNOSIS — M205X9 Other deformities of toe(s) (acquired), unspecified foot: Secondary | ICD-10-CM

## 2013-11-09 MED ORDER — DIAZEPAM 2 MG PO TABS: 2.0000 mg | ORAL_TABLET | Freq: Every evening | ORAL | Status: DC | PRN

## 2013-11-09 NOTE — Patient Instructions (Signed)

## 2013-11-09 NOTE — Progress Notes (Signed)
   Subjective:    Patient ID: Shelly Yates, female    DOB: Apr 28, 1946, 68 y.o.   MRN: [id number]  HPI PT STATED BOTH FEET HAVE BUNION AND IT HURTS. THE FEET GET AGGRAVATED BY WALKING AND GETTING WORSE. TRIED NO TREATMENT.    Review of Systems  Eyes: Positive for itching.  Musculoskeletal: Positive for gait problem and joint swelling.  Hematological: Bruises/bleeds easily.  All other systems reviewed and are negative.      Objective:   Physical Exam  Patient is awake, alert, and oriented x 3.  In no acute distress.  Vascular status is intact with palpable pedal pulses at 2/4 DP and PT bilateral and capillary refill time within normal limits. Neurological sensation is also intact bilaterally via Semmes Weinstein monofilament at 5/5 sites. Light touch, vibratory sensation, Achilles tendon reflex is intact. Dermatological exam reveals skin color, turger and texture as normal. No open lesions present.  Musculature intact with dorsiflexion, plantarflexion, inversion, eversion.  Decreased range of motion at the first mpj is present bilateral.  Mild contracture of the lesser digits is also present bilateral.  Flexible Forefoot cavus is present as well when non weight bearing.   xrays show hav deformities bilateral with arthritic changes present at the first mpj's bilateral.  Subjective night time cramping is related.     Assessment & Plan:  Hallux limitus, with hallux abductovalgus deformity bilateral.   Plan:  Discussed treatment options.  Recommended orthotics to help with the hallux limitus and valium to take at night for the cramps.  We will reevaluate in 3 weeks when the inserts should be back to see if the valium is helping the cramps.  Also discussed surgery to correct the bunion deformity and will try conservative therapy prior to surgical correction.

## 2013-12-05 ENCOUNTER — Other Ambulatory Visit: Payer: 59

## 2013-12-06 ENCOUNTER — Other Ambulatory Visit: Payer: Self-pay | Admitting: Gastroenterology

## 2013-12-09 ENCOUNTER — Ambulatory Visit (INDEPENDENT_AMBULATORY_CARE_PROVIDER_SITE_OTHER): Payer: 59 | Admitting: *Deleted

## 2013-12-09 DIAGNOSIS — M21619 Bunion of unspecified foot: Secondary | ICD-10-CM

## 2013-12-09 DIAGNOSIS — M2010 Hallux valgus (acquired), unspecified foot: Secondary | ICD-10-CM

## 2013-12-09 NOTE — Progress Notes (Signed)
   Subjective:    Patient ID: Shelly Yates, female    DOB: 1945-08-17, 68 y.o.   MRN: [id number]  HPI PICK UP ORTHOTICS AND GIVEN INSTRUCTION.   Review of Systems     Objective:   Physical Exam        Assessment & Plan:

## 2013-12-09 NOTE — Patient Instructions (Signed)

## 2013-12-12 ENCOUNTER — Other Ambulatory Visit: Payer: 59

## 2014-06-23 HISTORY — PX: CHOLECYSTECTOMY: SHX55

## 2015-07-03 DIAGNOSIS — H2512 Age-related nuclear cataract, left eye: Secondary | ICD-10-CM | POA: Diagnosis not present

## 2015-07-04 DIAGNOSIS — I10 Essential (primary) hypertension: Secondary | ICD-10-CM | POA: Diagnosis not present

## 2015-07-04 DIAGNOSIS — E7800 Pure hypercholesterolemia, unspecified: Secondary | ICD-10-CM | POA: Diagnosis not present

## 2015-07-11 DIAGNOSIS — I10 Essential (primary) hypertension: Secondary | ICD-10-CM | POA: Diagnosis not present

## 2015-07-11 DIAGNOSIS — E039 Hypothyroidism, unspecified: Secondary | ICD-10-CM | POA: Diagnosis not present

## 2015-07-11 DIAGNOSIS — R7301 Impaired fasting glucose: Secondary | ICD-10-CM | POA: Diagnosis not present

## 2015-07-11 DIAGNOSIS — E7800 Pure hypercholesterolemia, unspecified: Secondary | ICD-10-CM | POA: Diagnosis not present

## 2016-02-04 DIAGNOSIS — I10 Essential (primary) hypertension: Secondary | ICD-10-CM | POA: Diagnosis not present

## 2016-02-04 DIAGNOSIS — R7301 Impaired fasting glucose: Secondary | ICD-10-CM | POA: Diagnosis not present

## 2016-02-04 DIAGNOSIS — Z0000 Encounter for general adult medical examination without abnormal findings: Secondary | ICD-10-CM | POA: Diagnosis not present

## 2016-02-04 DIAGNOSIS — E7800 Pure hypercholesterolemia, unspecified: Secondary | ICD-10-CM | POA: Diagnosis not present

## 2016-02-04 DIAGNOSIS — E039 Hypothyroidism, unspecified: Secondary | ICD-10-CM | POA: Diagnosis not present

## 2016-02-11 DIAGNOSIS — I10 Essential (primary) hypertension: Secondary | ICD-10-CM | POA: Diagnosis not present

## 2016-02-11 DIAGNOSIS — E039 Hypothyroidism, unspecified: Secondary | ICD-10-CM | POA: Diagnosis not present

## 2016-02-11 DIAGNOSIS — Z0000 Encounter for general adult medical examination without abnormal findings: Secondary | ICD-10-CM | POA: Diagnosis not present

## 2016-02-11 DIAGNOSIS — E7800 Pure hypercholesterolemia, unspecified: Secondary | ICD-10-CM | POA: Diagnosis not present

## 2016-02-19 ENCOUNTER — Ambulatory Visit (INDEPENDENT_AMBULATORY_CARE_PROVIDER_SITE_OTHER): Payer: PPO

## 2016-02-19 ENCOUNTER — Encounter: Payer: Self-pay | Admitting: Podiatry

## 2016-02-19 ENCOUNTER — Ambulatory Visit (INDEPENDENT_AMBULATORY_CARE_PROVIDER_SITE_OTHER): Payer: PPO | Admitting: Podiatry

## 2016-02-19 DIAGNOSIS — M2012 Hallux valgus (acquired), left foot: Secondary | ICD-10-CM

## 2016-02-19 NOTE — Patient Instructions (Signed)

## 2016-02-20 NOTE — Progress Notes (Signed)
She presents today states that she is really having trouble with this bunion issue refers to the first metatarsophalangeal joint of the left foot. He states it really rubs with shoes coming more more painful is beginning to affect her ability to perform her daily everyday activities including her job.  Objective: Vital signs are stable she is alert and oriented 3 have reviewed her past medical history medications allergy surgery social history and review of systems. Pulses are strongly palpable. Neurologic sensorium is intact. Deep tendon reflexes are intact. Hallux abductovalgus deformity is noted left foot. She also has contracted second metatarsophalangeal joint. Radiographs taken today demonstrate significant osteoarthritis and osteoporosis.  Assessment: Hallux abductovalgus deformity with contracted second metatarsophalangeal joint. Osteoarthritis first metatarsophalangeal joint.  Plan: Discussed etiology pathology conservative or surgical therapies. I explained to her that to realign the metatarsophalangeal joint would be opening a can of worms because of the osteoarthritis that is surrounding the joint. She does not want the joint fused. She just simply wants to have the bump removed so she can wear regular shoes. She also like to have second toe to purchase the ground better. At this point we consented her today for McBride bunion repair with a tenotomy and capsulotomy of the second metatarsophalangeal joint. We did discuss the possible postoperative complications which may include but are not limited to postop pain bleeding swelling infection recurrence and need for further surgery. She understands this is amenable to and I will follow-up with her in the near future for surgical intervention we are requesting medical clearance.

## 2016-03-18 DIAGNOSIS — M67432 Ganglion, left wrist: Secondary | ICD-10-CM | POA: Diagnosis not present

## 2016-04-15 DIAGNOSIS — M67432 Ganglion, left wrist: Secondary | ICD-10-CM | POA: Diagnosis not present

## 2016-05-01 DIAGNOSIS — M67432 Ganglion, left wrist: Secondary | ICD-10-CM | POA: Diagnosis not present

## 2016-05-13 DIAGNOSIS — M67432 Ganglion, left wrist: Secondary | ICD-10-CM | POA: Diagnosis not present

## 2016-06-02 DIAGNOSIS — E7800 Pure hypercholesterolemia, unspecified: Secondary | ICD-10-CM | POA: Diagnosis not present

## 2016-06-06 ENCOUNTER — Telehealth: Payer: Self-pay | Admitting: *Deleted

## 2016-06-06 ENCOUNTER — Encounter: Payer: Self-pay | Admitting: *Deleted

## 2016-06-06 NOTE — Telephone Encounter (Signed)
I am calling to let you know we are going to request medical clearance from Dr. Ashby Dawes prior to your surgery on December 22.  "Okay, thank you."

## 2016-06-12 ENCOUNTER — Other Ambulatory Visit: Payer: Self-pay | Admitting: Podiatry

## 2016-06-12 MED ORDER — CEPHALEXIN 500 MG PO CAPS: 500.0000 mg | ORAL_CAPSULE | Freq: Three times a day (TID) | ORAL | 0 refills | Status: DC

## 2016-06-12 MED ORDER — HYDROMORPHONE HCL 4 MG PO TABS: 4.0000 mg | ORAL_TABLET | ORAL | 0 refills | Status: DC | PRN

## 2016-06-12 MED ORDER — ONDANSETRON HCL 4 MG PO TABS: 4.0000 mg | ORAL_TABLET | Freq: Three times a day (TID) | ORAL | 0 refills | Status: DC | PRN

## 2016-06-13 ENCOUNTER — Encounter: Payer: Self-pay | Admitting: Podiatry

## 2016-06-13 DIAGNOSIS — M205X2 Other deformities of toe(s) (acquired), left foot: Secondary | ICD-10-CM | POA: Diagnosis not present

## 2016-06-13 DIAGNOSIS — M25572 Pain in left ankle and joints of left foot: Secondary | ICD-10-CM | POA: Diagnosis not present

## 2016-06-13 DIAGNOSIS — M24575 Contracture, left foot: Secondary | ICD-10-CM | POA: Diagnosis not present

## 2016-06-13 DIAGNOSIS — M7752 Other enthesopathy of left foot: Secondary | ICD-10-CM | POA: Diagnosis not present

## 2016-06-13 DIAGNOSIS — M2012 Hallux valgus (acquired), left foot: Secondary | ICD-10-CM | POA: Diagnosis not present

## 2016-06-13 DIAGNOSIS — I10 Essential (primary) hypertension: Secondary | ICD-10-CM | POA: Diagnosis not present

## 2016-06-19 ENCOUNTER — Ambulatory Visit (INDEPENDENT_AMBULATORY_CARE_PROVIDER_SITE_OTHER): Payer: PPO | Admitting: Podiatry

## 2016-06-19 ENCOUNTER — Ambulatory Visit (INDEPENDENT_AMBULATORY_CARE_PROVIDER_SITE_OTHER): Payer: PPO

## 2016-06-19 ENCOUNTER — Encounter: Payer: Self-pay | Admitting: Podiatry

## 2016-06-19 VITALS — Temp 97.1°F

## 2016-06-19 DIAGNOSIS — M2012 Hallux valgus (acquired), left foot: Secondary | ICD-10-CM

## 2016-06-19 DIAGNOSIS — Z9889 Other specified postprocedural states: Secondary | ICD-10-CM

## 2016-06-19 NOTE — Progress Notes (Signed)
Subjective: Shelly Yates is a 70 y.o. is seen today in office s/p left McBride bunionectomy and tenotomy of the left 2nd toe preformed on 06/13/16. They state their pain is controlled and she is no longer taking pain medication.She is taking antibiotics as directed. Denies any systemic complaints such as fevers, chills, nausea, vomiting. No calf pain, chest pain, shortness of breath.   Objective: General: No acute distress, AAOx3  DP/PT pulses palpable 2/4, CRT < 3 sec to all digits.  Protective sensation intact. Motor function intact.  Left foot: Incision is well coapted without any evidence of dehiscence and sutures are intact. There is no surrounding erythema, ascending cellulitis, fluctuance, crepitus, malodor, drainage/purulence. There is mild edema around the surgical site. There is no significant pain along the surgical site. Small serous filled bulla to the dorsal 2nd MPTJ. Upon drainage no pus.  No other areas of tenderness to bilateral lower extremities.  No other open lesions or pre-ulcerative lesions.  No pain with calf compression, swelling, warmth, erythema.   Assessment and Plan:  Status post left foot surgery, doing well with no complications   -Treatment options discussed including all alternatives, risks, and complications -X-rays were obtained and reviewed with the patient. No evidence of acute fracture -antibiotic ointment was applied followed by a bandage. Keep clean, dry, intact. The bulla was drained prior to dressing. -Continue CAM boot.  -Ice/elevation -Pain medication as needed. -Monitor for any clinical signs or symptoms of infection and DVT/PE and directed to call the office immediately should any occur or go to the ER. -Follow-up in 1 week for possible suture removal or sooner if any problems arise. In the meantime, encouraged to call the office with any questions, concerns, change in symptoms.   Celesta Gentile, DPM

## 2016-06-24 NOTE — Progress Notes (Signed)
DOS 12.22.2017 McBride Bunionectomy Left; Tenotomy/Capsuloty 2nd MPJ Left Foot

## 2016-06-26 ENCOUNTER — Ambulatory Visit: Payer: PPO | Admitting: Podiatry

## 2016-06-26 DIAGNOSIS — M2012 Hallux valgus (acquired), left foot: Secondary | ICD-10-CM

## 2016-06-26 DIAGNOSIS — Z9889 Other specified postprocedural states: Secondary | ICD-10-CM

## 2016-06-26 NOTE — Progress Notes (Signed)
She presents today 2 weeks status post [place] bunion repair and hammertoe repair left foot. She denies fever chills nausea vomiting muscle aches and pains states that she is already gone back to work and keeping her foot elevated on a box under her desk. She denies chest pain shortness of breath or calf pain.  Objective: Vital signs are stable she is alert and oriented 3 moderate edema no erythema cellulitis drainage or odor I am concerned about releasing the stitches at this point. I don't think that it'll be a good idea to remove them today I think we should wait another week. No signs of infection.  Assessment: 1 nonsurgical foot left.  Plan: Redress today drastic compressive dressing follow-up with me in 1 week for suture removal

## 2016-07-03 ENCOUNTER — Ambulatory Visit (INDEPENDENT_AMBULATORY_CARE_PROVIDER_SITE_OTHER): Payer: PPO | Admitting: Podiatry

## 2016-07-03 ENCOUNTER — Ambulatory Visit (INDEPENDENT_AMBULATORY_CARE_PROVIDER_SITE_OTHER): Payer: PPO

## 2016-07-03 DIAGNOSIS — Z9889 Other specified postprocedural states: Secondary | ICD-10-CM

## 2016-07-03 DIAGNOSIS — M2012 Hallux valgus (acquired), left foot: Secondary | ICD-10-CM

## 2016-07-03 NOTE — Progress Notes (Signed)
She presents today for follow-up of her surgical foot date of surgery 06/13/2016. She states that she is doing very well without complications.  Objective: Vital signs are stable she is alert and oriented 3. Pulses are palpable. At this point sutures were removed bars remain well coapted there is no signs of infection we will place her in a compression anklet and a Darco shoe.  Assessment: Well as surgical foot left.  Plan: Darco shoe and a compression anklet follow up with me in 2 weeks at which time we'll try to get into a tennis shoe.

## 2016-07-17 ENCOUNTER — Ambulatory Visit (INDEPENDENT_AMBULATORY_CARE_PROVIDER_SITE_OTHER): Payer: PPO

## 2016-07-17 ENCOUNTER — Ambulatory Visit (INDEPENDENT_AMBULATORY_CARE_PROVIDER_SITE_OTHER): Payer: Self-pay | Admitting: Podiatry

## 2016-07-17 DIAGNOSIS — M2012 Hallux valgus (acquired), left foot: Secondary | ICD-10-CM

## 2016-07-17 NOTE — Progress Notes (Signed)
She presents today for follow-up of her McBride bunionectomy left foot. A surgery 06/13/2016. Tenotomy capsulotomy second metatarsophalangeal joint of the left foot. She states that she is doing very well without complications.  Objective: Vital signs are stable she is alert and oriented 3 some stiffness of the first metatarsophalangeal joint though fairly flexible not a lot of edema and no signs of infection. Radiographs confirm well-healing surgical foot.  Assessment: While in surgical foot left.  Plan: Encourage range of motion exercises and getting back to regular shoe gear will follow-up with her in 1 month.

## 2016-08-14 ENCOUNTER — Ambulatory Visit (INDEPENDENT_AMBULATORY_CARE_PROVIDER_SITE_OTHER): Payer: Self-pay | Admitting: Podiatry

## 2016-08-14 ENCOUNTER — Ambulatory Visit (INDEPENDENT_AMBULATORY_CARE_PROVIDER_SITE_OTHER): Payer: PPO

## 2016-08-14 DIAGNOSIS — M2012 Hallux valgus (acquired), left foot: Secondary | ICD-10-CM | POA: Diagnosis not present

## 2016-08-14 NOTE — Progress Notes (Signed)
She presents today 2 months status post McBride bunion repair left foot tenotomy second digit left foot. She states that she's tried using regular shoes intermittently. She really denies having much pain with this at all.  Objective: Vital signs are stable alert and oriented 3 pulses are palpable. Minimal edema about the foot or range of motion of the first metatarsophalangeal joint second toe setting rectus in good position.  Assessment: Well-healing surgical foot left.  Plan: I will follow-up with her in 4-6 weeks continue the effort to get into regular shoe gear and regular activity.

## 2016-09-30 ENCOUNTER — Ambulatory Visit (INDEPENDENT_AMBULATORY_CARE_PROVIDER_SITE_OTHER): Payer: PPO

## 2016-09-30 ENCOUNTER — Ambulatory Visit (INDEPENDENT_AMBULATORY_CARE_PROVIDER_SITE_OTHER): Payer: Self-pay | Admitting: Podiatry

## 2016-09-30 ENCOUNTER — Encounter: Payer: Self-pay | Admitting: Podiatry

## 2016-09-30 DIAGNOSIS — M2032 Hallux varus (acquired), left foot: Secondary | ICD-10-CM

## 2016-09-30 DIAGNOSIS — M2012 Hallux valgus (acquired), left foot: Secondary | ICD-10-CM

## 2016-09-30 NOTE — Progress Notes (Signed)
She presents today for follow-up of her surgery date 06/13/2016. She states that I have noticed the toe is starting become more angled and will not sit down on the floor completely.  Objective: Vital signs are stable she is alert and oriented 3. Pulses are palpable. Neurologic sensorium is intact. 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  During surgery we had to remove a very large consolidated sesamoid which was severely arthritic. This is more than likely resulted and a tear of her lateral capsule and over contraction of her flexor hallucis brevis.  Assessment: Hallux varus today associated with osteoarthritic change and surgical intervention.  Plan: I discussed with her in great detail today the need for surgical intervention consisting of either a fusion or a Keller arthroplasty with a single silicone implant and a possible hallux interphalangeal joint fusion. She understands this is amenable to it. She would like to hold off until November possible but I will follow-up with her in a couple of months just to check the foot. Radiographs will be taken at that time to assess worsening of the condition.

## 2016-12-02 ENCOUNTER — Ambulatory Visit (INDEPENDENT_AMBULATORY_CARE_PROVIDER_SITE_OTHER): Payer: PPO

## 2016-12-02 ENCOUNTER — Ambulatory Visit (INDEPENDENT_AMBULATORY_CARE_PROVIDER_SITE_OTHER): Payer: PPO | Admitting: Podiatry

## 2016-12-02 ENCOUNTER — Encounter: Payer: Self-pay | Admitting: Podiatry

## 2016-12-02 VITALS — BP 112/70 | HR 85 | Resp 16

## 2016-12-02 DIAGNOSIS — M2012 Hallux valgus (acquired), left foot: Secondary | ICD-10-CM

## 2016-12-02 MED ORDER — MELOXICAM 15 MG PO TABS: 15.0000 mg | ORAL_TABLET | Freq: Every day | ORAL | 3 refills | Status: DC

## 2016-12-02 NOTE — Progress Notes (Signed)
She presents today for a follow-up of her McBride bunionectomy where we had to remove the entire sesamoid was fused in 1 large sesamoidal apparatus. At this point she has developed a hallux varus. She states the foot is still sore and swollen and hurts around the big toe. She feels a lump on the bottom between the big toe and second toe.  Objective: Vital signs are stable she is alert and oriented 3 there is no erythema is mild edema no cellulitis drainage or odor the hallux is in a valgus position left. It is reducible but is painfully reducible. Radiographs taken today do demonstrate a dorsal dislocation and medial dislocation of the toe.  Assessment: Hallux varus status post McBride bunion repair.  Plan: I expressed to her that at this point she would need to have a fusion of the first metatarsophalangeal joint and that she will be nonweightbearing for a period of 6-8 weeks. She states that she would have to wait until after November to have this done. I recommended that she follow up with [place] in August or September for consult.

## 2017-04-07 ENCOUNTER — Ambulatory Visit (INDEPENDENT_AMBULATORY_CARE_PROVIDER_SITE_OTHER): Payer: PPO | Admitting: Podiatry

## 2017-04-07 DIAGNOSIS — M2032 Hallux varus (acquired), left foot: Secondary | ICD-10-CM | POA: Diagnosis not present

## 2017-04-07 NOTE — Progress Notes (Signed)
She presents today for surgical consult regarding her first metatarsophalangeal joint of the left foot. She states that just seems to be getting worse.  Objective: Vital signs are stable alert and oriented 3. Pulses are palpable. Hallux varus deformity with malleus deformity left foot. Pulses remain palpable.  Assessment: Hallux varus and hallux malleus.  Plan: We discussed the etiology pathology conservative versus surgical therapies. At this point she states that she is about to have a physical exam performed. Told her if there are any complications with her exam to let us know. But at this point we consented her today for a fusion of the first metatarsophalangeal joint possible arthroplasty or fusion of the hallux interphalangeal joint. She understands this is amenable to it we did discuss possible postop complications which may include but are not limited to postop pain bleeding swelling infection recurrence in for further surgery overcorrection under correction failure of the wound to heal or failure of the bone to unite. She will be in a cast postoperatively understands that she will need a knee scooter and/or crutches but she will remain nonweightbearing for a period of up to 8 weeks. She understands this is amenable to it we did discuss the surgical center including the anesthesia. I will follow-up with her in the near future for surgical intervention Dr. Joya Gaskins will assist me and we will utilize Arthrex screws and plates.

## 2017-04-07 NOTE — Patient Instructions (Signed)
Pre-Operative Instructions  Congratulations, you have decided to take an important step towards improving your quality of life.  You can be assured that the doctors and staff at [facility] will be with you every step of the way.  Here are some important things you should know:  1. Plan to be at the surgery center/hospital at least 1 (one) hour prior to your scheduled time, unless otherwise directed by the surgical center/hospital staff.  You must have a responsible adult accompany you, remain during the surgery and drive you home.  Make sure you have directions to the surgical center/hospital to ensure you arrive on time. 2. If you are having surgery at [facility] or [facility], you will need a copy of your medical history and physical form from your family physician within one month prior to the date of surgery. We will give you a form for your primary physician to complete.  3. We make every effort to accommodate the date you request for surgery.  However, there are times where surgery dates or times have to be moved.  We will contact you as soon as possible if a change in schedule is required.   4. No aspirin/ibuprofen for one week before surgery.  If you are on aspirin, any non-steroidal anti-inflammatory medications (Mobic, Aleve, Ibuprofen) should not be taken seven (7) days prior to your surgery.  You make take Tylenol for pain prior to surgery.  5. Medications - If you are taking daily heart and blood pressure medications, seizure, reflux, allergy, asthma, anxiety, pain or diabetes medications, make sure you notify the surgery center/hospital before the day of surgery so they can tell you which medications you should take or avoid the day of surgery. 6. No food or drink after midnight the night before surgery unless directed otherwise by surgical center/hospital staff. 7. No alcoholic beverages 24-hours prior to surgery.  No smoking 24-hours prior or 24-hours after  surgery. 8. Wear loose pants or shorts. They should be loose enough to fit over bandages, boots, and casts. 9. Don't wear slip-on shoes. Sneakers are preferred. 10. Bring your boot with you to the surgery center/hospital.  Also bring crutches or a walker if your physician has prescribed it for you.  If you do not have this equipment, it will be provided for you after surgery. 11. If you have not been contacted by the surgery center/hospital by the day before your surgery, call to confirm the date and time of your surgery. 12. Leave-time from work may vary depending on the type of surgery you have.  Appropriate arrangements should be made prior to surgery with your employer. 13. Prescriptions will be provided immediately following surgery by your doctor.  Fill these as soon as possible after surgery and take the medication as directed. Pain medications will not be refilled on weekends and must be approved by the doctor. 14. Remove nail polish on the operative foot and avoid getting pedicures prior to surgery. 15. Wash the night before surgery.  The night before surgery wash the foot and leg well with water and the antibacterial soap provided. Be sure to pay special attention to beneath the toenails and in between the toes.  Wash for at least three (3) minutes. Rinse thoroughly with water and dry well with a towel.  Perform this wash unless told not to do so by your physician.  Enclosed: 1 Ice pack (please put in freezer the night before surgery)   1 Hibiclens skin cleaner     Pre-op instructions  If you have any questions regarding the instructions, please do not hesitate to call our office.  [place]: [address], [place], [place] [address] -- [phone]  [place]: [address] [place], [place] [address] -- [phone]  [place]: [address]  [place], [place] [address] -- [phone]  [place]: [address], [address], [place],  [address] -- [phone]  Website:  [email or web address] 

## 2017-05-19 ENCOUNTER — Telehealth: Payer: Self-pay | Admitting: *Deleted

## 2017-05-19 NOTE — Telephone Encounter (Signed)
I attempted to call patient to verify we are supposed to have her scheduled for December 14.  I left her a message to call me back.  "I'm returning your call."  I was calling to verify that we're supposed to have you scheduled for surgery on December 14.  "Yes, that is correct.  I am supposed to use a scooter."  Do you have a scooter?  "Yes, one of my friends let me have hers.  Do I take it with me to surgery?"  I would take it just in case.  "Okay, I'll have my husband to put it in the trunk and he can get it if I need it."

## 2017-05-23 HISTORY — PX: FOOT SURGERY: SHX648

## 2017-06-04 ENCOUNTER — Other Ambulatory Visit: Payer: Self-pay | Admitting: Podiatry

## 2017-06-04 MED ORDER — PROMETHAZINE HCL 25 MG PO TABS: 25.0000 mg | ORAL_TABLET | Freq: Three times a day (TID) | ORAL | 0 refills | Status: DC | PRN

## 2017-06-04 MED ORDER — HYDROMORPHONE HCL 4 MG PO TABS: 4.0000 mg | ORAL_TABLET | ORAL | 0 refills | Status: DC | PRN

## 2017-06-04 MED ORDER — CEPHALEXIN 500 MG PO CAPS
500.0000 mg | ORAL_CAPSULE | Freq: Three times a day (TID) | ORAL | 0 refills | Status: DC
Start: 2017-06-04 — End: 2017-09-03

## 2017-06-04 NOTE — Progress Notes (Unsigned)
.  nn

## 2017-06-05 ENCOUNTER — Encounter: Payer: Self-pay | Admitting: Podiatry

## 2017-06-05 DIAGNOSIS — M2042 Other hammer toe(s) (acquired), left foot: Secondary | ICD-10-CM | POA: Diagnosis not present

## 2017-06-05 DIAGNOSIS — M25675 Stiffness of left foot, not elsewhere classified: Secondary | ICD-10-CM | POA: Diagnosis not present

## 2017-06-05 DIAGNOSIS — M25572 Pain in left ankle and joints of left foot: Secondary | ICD-10-CM | POA: Diagnosis not present

## 2017-06-05 DIAGNOSIS — M2032 Hallux varus (acquired), left foot: Secondary | ICD-10-CM | POA: Diagnosis not present

## 2017-06-05 DIAGNOSIS — I10 Essential (primary) hypertension: Secondary | ICD-10-CM | POA: Diagnosis not present

## 2017-06-05 DIAGNOSIS — M2022 Hallux rigidus, left foot: Secondary | ICD-10-CM | POA: Diagnosis not present

## 2017-06-08 ENCOUNTER — Telehealth: Payer: Self-pay | Admitting: *Deleted

## 2017-06-08 NOTE — Telephone Encounter (Signed)
POST OP CALL-  Called pt- she states that her pain has been under control and taking her pain meds regularly. Confirmed pt appt for Thursday.

## 2017-06-11 ENCOUNTER — Ambulatory Visit (INDEPENDENT_AMBULATORY_CARE_PROVIDER_SITE_OTHER): Payer: PPO | Admitting: Podiatry

## 2017-06-11 ENCOUNTER — Ambulatory Visit (INDEPENDENT_AMBULATORY_CARE_PROVIDER_SITE_OTHER): Payer: PPO

## 2017-06-11 ENCOUNTER — Ambulatory Visit: Payer: PPO

## 2017-06-11 VITALS — BP 103/71 | HR 82 | Temp 97.9°F

## 2017-06-11 DIAGNOSIS — M2012 Hallux valgus (acquired), left foot: Secondary | ICD-10-CM

## 2017-06-11 DIAGNOSIS — M2032 Hallux varus (acquired), left foot: Secondary | ICD-10-CM

## 2017-06-11 DIAGNOSIS — Z9889 Other specified postprocedural states: Secondary | ICD-10-CM

## 2017-06-11 NOTE — Progress Notes (Signed)
She presents today status post first metatarsophalangeal joint fusion and hallux interphalangeal joint fusion.  She states that she is doing very well and has had no accidents yet.  She denies chest pain shortness of breath calf pain.  Objective: Vital signs are stable alert and oriented x3 dry sterile dressing is intact was removed demonstrates minimal erythema no edema cellulitis drainage or odor to the left foot.  Sutures are intact margins appear to be well coapted there is no signs of infection.  Radiographs taken today 3 views in the office of the left foot demonstrates a well-placed 4 degree first metatarsophalangeal joint fusion plate with a hallux interphalangeal joint fusion.  Assessment: Well-healing surgical foot left.  Plan: At this point I am going to recommend that she continue to keep this elevated and stay off of it as much as possible.  She can only touch the heel to the floor if necessary.  She is not to come across that forefoot for any reason.  We redressed the foot today with a dry sterile compressive dressing and I will follow-up with her in 1 week to consider suture removal.

## 2017-06-18 ENCOUNTER — Encounter: Payer: Self-pay | Admitting: Podiatry

## 2017-06-18 ENCOUNTER — Ambulatory Visit (INDEPENDENT_AMBULATORY_CARE_PROVIDER_SITE_OTHER): Payer: PPO | Admitting: Podiatry

## 2017-06-18 DIAGNOSIS — M2012 Hallux valgus (acquired), left foot: Secondary | ICD-10-CM

## 2017-06-18 NOTE — Progress Notes (Signed)
She presents today 2 weeks status post first metatarsophalangeal joint fusion and hallux interphalangeal joint fusion date of surgery 06/05/2017.  States that she is doing very well and not taking medications.  States that she has not had any accidents regarding the knee scooter or the Cam walker.  Objective: Vital signs are stable she is alert and oriented x3 margins are not well coapted yet.  There is no signs of infection.  Much decrease in edema and erythema from previous evaluation.  Assessment: Well-healing surgical foot left.  Plan: At this point I am going to request that she continue nonweightbearing status redressed today dressed a compressive dressing follow-up with her in 1 week for suture removal at which time we will allow her to start getting her foot wet.

## 2017-06-25 ENCOUNTER — Encounter: Payer: Self-pay | Admitting: Podiatry

## 2017-06-25 ENCOUNTER — Ambulatory Visit (INDEPENDENT_AMBULATORY_CARE_PROVIDER_SITE_OTHER): Payer: PPO | Admitting: Podiatry

## 2017-06-25 ENCOUNTER — Ambulatory Visit (INDEPENDENT_AMBULATORY_CARE_PROVIDER_SITE_OTHER): Payer: PPO

## 2017-06-25 DIAGNOSIS — M2032 Hallux varus (acquired), left foot: Secondary | ICD-10-CM

## 2017-06-25 DIAGNOSIS — M2012 Hallux valgus (acquired), left foot: Secondary | ICD-10-CM

## 2017-06-27 NOTE — Progress Notes (Signed)
She presents today for follow-up of her first metatarsophalangeal joint fusion date of surgery 06/05/2017.  She also had a hallux interphalangeal joint fusion.  She states that it seems to be doing pretty good she continues to be nonweightbearing.  Objective: Vital signs are stable she is alert and oriented x3.  Pulses are palpable.  Margins well coapted we went ahead and removed all of the sutures today since they were well coapted I am allow her to start getting this wet.  Assessment: Well-healing surgical foot.  Plan: Follow-up with her in 2 weeks at which time we may start weightbearing but I would like another set of x-rays.

## 2017-07-02 ENCOUNTER — Telehealth: Payer: Self-pay | Admitting: Podiatry

## 2017-07-02 NOTE — Telephone Encounter (Signed)
Pt states has surgery on left foot and has severe pain in the right leg and can barely walk, pain starts up in the back and the hip and goes down the center of the back of the leg and knee and outer side of the knee, denies swelling, redness, heat or hardness to the right lower limb. Pt states it is more of a sharp pain.

## 2017-07-02 NOTE — Telephone Encounter (Signed)
I'm a pt of Dr. Stephenie Acres and I'm not scheduled to come back in until next week. However, I am having severe pain in my right leg. If you could please give me a call at [phone]. Thank you.

## 2017-07-02 NOTE — Telephone Encounter (Signed)
She should probably go to the ED if her pain is that severe.  Plus its sorta out of our scope

## 2017-07-02 NOTE — Telephone Encounter (Signed)
I informed pt of Dr. Stephenie Acres recommendation to go to the ED emergency department, or if not go to her PCP. Pt states understanding.

## 2017-07-09 ENCOUNTER — Ambulatory Visit (INDEPENDENT_AMBULATORY_CARE_PROVIDER_SITE_OTHER): Payer: PPO

## 2017-07-09 ENCOUNTER — Ambulatory Visit (INDEPENDENT_AMBULATORY_CARE_PROVIDER_SITE_OTHER): Payer: PPO | Admitting: Podiatry

## 2017-07-09 DIAGNOSIS — M2012 Hallux valgus (acquired), left foot: Secondary | ICD-10-CM

## 2017-07-09 DIAGNOSIS — Z9889 Other specified postprocedural states: Secondary | ICD-10-CM

## 2017-07-09 DIAGNOSIS — M2032 Hallux varus (acquired), left foot: Secondary | ICD-10-CM

## 2017-07-09 NOTE — Progress Notes (Signed)
She presents today for follow-up of her first metatarsal phalangeal joint fusion as well as hallux interphalangeal joint fusion on the left foot.  She states that I am ready to get away from these crutches.  She states that she has no pain whatsoever.  She denies calf calf pain chest pain shortness of breath.  Objective: Presents today with her Cam Walker nonweightbearing status using her crutches.  Plantar aspect of the Cam walker appears to be clean.  Dry sterile dressing was removed demonstrates no erythema no edema no cellulitis drainage or odor incision site is gone on to heal uneventfully.  She has no pain on attempted range of motion of the IPJ or the first metatarsophalangeal joint.  Radiographs demonstrate complete arthrodesis of the hallux interphalangeal joint with remaining space between the proximal phalanx and the first metatarsal that has not arthrodesed as of yet.  Assessment: Well-healing surgical foot non-complicated no pain at this point.  Plan: I am recommending that she start ambulating utilizing her Cam walker only.  She is not to walk without the Cam walker.  I think a small amount of motion would probably help stimulate bone healing at the first metatarsal phalangeal joint.  I will follow-up with her in 2 weeks for another set of x-rays.

## 2017-07-30 ENCOUNTER — Ambulatory Visit (INDEPENDENT_AMBULATORY_CARE_PROVIDER_SITE_OTHER): Payer: PPO

## 2017-07-30 ENCOUNTER — Encounter: Payer: Self-pay | Admitting: Podiatry

## 2017-07-30 ENCOUNTER — Ambulatory Visit (INDEPENDENT_AMBULATORY_CARE_PROVIDER_SITE_OTHER): Payer: PPO | Admitting: Podiatry

## 2017-07-30 DIAGNOSIS — M2012 Hallux valgus (acquired), left foot: Secondary | ICD-10-CM | POA: Diagnosis not present

## 2017-08-02 NOTE — Progress Notes (Signed)
She presents today for postop visit date of surgery was 06/05/2017.  Not quite 2 months ago.  Status post hallux interphalangeal joint fusion and fusion of the first metatarsophalangeal joint.  She states that is feeling good I get some tingling every now and then but otherwise is doing well she continues to wear her Darco shoe.  Objective: Vital signs are stable she is alert and oriented x3.  Pulses are palpable.  Neurologic sensorium is intact minimal edema no erythema cellulitis drainage or odor toe sets rectus with weightbearing.  Radiographs taken today demonstrates well-healing hallux interphalangeal joint and first metatarsal phalangeal joint though there is some lucency in the head of the metatarsal.  Assessment: Well-placed well-healing surgical toe.  Plan: We will allow her to continue to walk on it utilizing the Darco shoe I will follow-up with her 4-6 weeks.

## 2017-09-03 ENCOUNTER — Ambulatory Visit (INDEPENDENT_AMBULATORY_CARE_PROVIDER_SITE_OTHER): Payer: PPO

## 2017-09-03 ENCOUNTER — Encounter: Payer: Self-pay | Admitting: Podiatry

## 2017-09-03 ENCOUNTER — Ambulatory Visit (INDEPENDENT_AMBULATORY_CARE_PROVIDER_SITE_OTHER): Payer: PPO | Admitting: Podiatry

## 2017-09-03 DIAGNOSIS — M2032 Hallux varus (acquired), left foot: Secondary | ICD-10-CM

## 2017-09-03 DIAGNOSIS — M2012 Hallux valgus (acquired), left foot: Secondary | ICD-10-CM

## 2017-09-05 NOTE — Progress Notes (Signed)
She presents back to [place] today after having been in [place] for a little while states that she was doing very good she states that it did swell up for bed while she was in [place] but overall is doing fantastic.  She states that the tip of the toes just a little bit sore with a screw went in.  Date of surgery June 05, 2017 first metatarsophalangeal joint fusion as well as hallux interphalangeal joint fusion left foot.  She denies fever chills nausea vomiting muscle aches and pains.  Objective: Vital signs are stable she is alert and oriented x3 evaluation of the left foot demonstrates a rectus hallux upon weightbearing.  There is no motion at the metatarsal phalangeal joint.  Radiographs demonstrate a well healing arthrodesis at the hallux interphalangeal joint and first metatarsophalangeal joint internal fixation is still in good position.  Assessment: Well-healing surgical toe and foot left.  Plan: I will follow-up with her in 1-2 months.  She will notify us with any questions or concerns regarding this.

## 2017-11-03 ENCOUNTER — Ambulatory Visit (INDEPENDENT_AMBULATORY_CARE_PROVIDER_SITE_OTHER): Payer: PPO

## 2017-11-03 ENCOUNTER — Ambulatory Visit (INDEPENDENT_AMBULATORY_CARE_PROVIDER_SITE_OTHER): Payer: PPO | Admitting: Podiatry

## 2017-11-03 ENCOUNTER — Encounter: Payer: Self-pay | Admitting: Podiatry

## 2017-11-03 DIAGNOSIS — M2012 Hallux valgus (acquired), left foot: Secondary | ICD-10-CM

## 2017-11-03 DIAGNOSIS — Z9889 Other specified postprocedural states: Secondary | ICD-10-CM

## 2017-11-03 DIAGNOSIS — L600 Ingrowing nail: Secondary | ICD-10-CM | POA: Diagnosis not present

## 2017-11-03 MED ORDER — NEOMYCIN-POLYMYXIN-HC 1 % OT SOLN: OTIC | 1 refills | Status: DC

## 2017-11-03 NOTE — Progress Notes (Signed)
She presents today chief complaint of a painful ingrown toenail to the fibular border of the hallux left.  She is status post fusion of the hallux interphalangeal joint as well as the first metatarsophalangeal joint there is surgery June 05, 2017.  She states that the fusion site is doing well however feel like I have an ingrown as she points to the fibular border of the hallux left.  Objective: Vital signs are stable she is alert and oriented x3 sharp incurvated nail margin along the fibular border of the hallux left indicative of ingrown nail.  Is exquisitely tender on palpation.  Pulses are strong and palpable neurologic sensorium is intact.  She has no erythema edema sialitis drainage or odor.  Surgical site is gone on to heal uneventfully.  Radiographs confirm complete fusion of the hallux interphalangeal joint and the first metatarsophalangeal joint all of the left foot.  Assessment: Ingrown nail paronychia abscess hallux left.  Well-healing surgical foot left.  Plan: Chemical matricectomy was performed today after local anesthesia was administered.  She tolerated procedure well.  Local anesthesia consisted of 3 cc of 50-50 Mr. Marcaine plain lidocaine plain injected about the hallux left.  The fibular matrixectomy was performed she tolerated this procedure well she was provided with both oral and written home-going instructions for the care and soaking of her toe and I will follow-up with her in 1 to 2 weeks.  Also wrote a prescription for Cortisporin Otic to be applied twice daily after soaking.

## 2017-11-03 NOTE — Patient Instructions (Signed)

## 2017-11-19 ENCOUNTER — Encounter: Payer: Self-pay | Admitting: Podiatry

## 2017-11-19 ENCOUNTER — Ambulatory Visit (INDEPENDENT_AMBULATORY_CARE_PROVIDER_SITE_OTHER): Payer: PPO | Admitting: Podiatry

## 2017-11-19 ENCOUNTER — Ambulatory Visit: Payer: PPO | Admitting: Podiatry

## 2017-11-19 DIAGNOSIS — Z9889 Other specified postprocedural states: Secondary | ICD-10-CM

## 2017-11-19 DIAGNOSIS — L600 Ingrowing nail: Secondary | ICD-10-CM

## 2017-11-19 NOTE — Progress Notes (Signed)
She presents today for follow-up of her matrixectomy fibular border hallux left.  She states that is doing much better and the foot feels so much better than it did previously.  Objective: Vital signs are stable she is alert and oriented x3.  Pulses are palpable.  Neurologic sensorium is intact.  Fibular border of the hallux left is gone on to heal uneventfully there is no erythema edema cellulitis drainage or odor no purulence no malodor and the margin is well coapted against the nail plate.  Assessment: Well-healing surgical toe fibular border hallux left.  Plan: Discontinue soaks follow-up with me on an as-needed basis.

## 2017-11-19 NOTE — Patient Instructions (Signed)

## 2018-02-08 DIAGNOSIS — I10 Essential (primary) hypertension: Secondary | ICD-10-CM | POA: Diagnosis not present

## 2018-02-08 DIAGNOSIS — Z7689 Persons encountering health services in other specified circumstances: Secondary | ICD-10-CM | POA: Diagnosis not present

## 2018-02-08 DIAGNOSIS — Z0000 Encounter for general adult medical examination without abnormal findings: Secondary | ICD-10-CM | POA: Diagnosis not present

## 2018-02-11 DIAGNOSIS — Z0000 Encounter for general adult medical examination without abnormal findings: Secondary | ICD-10-CM | POA: Diagnosis not present

## 2018-02-11 DIAGNOSIS — I10 Essential (primary) hypertension: Secondary | ICD-10-CM | POA: Diagnosis not present

## 2018-02-11 DIAGNOSIS — M5416 Radiculopathy, lumbar region: Secondary | ICD-10-CM | POA: Diagnosis not present

## 2018-02-11 DIAGNOSIS — Z23 Encounter for immunization: Secondary | ICD-10-CM | POA: Diagnosis not present

## 2018-02-11 DIAGNOSIS — M545 Low back pain: Secondary | ICD-10-CM | POA: Diagnosis not present

## 2018-02-11 DIAGNOSIS — M48061 Spinal stenosis, lumbar region without neurogenic claudication: Secondary | ICD-10-CM | POA: Diagnosis not present

## 2018-02-11 DIAGNOSIS — R7301 Impaired fasting glucose: Secondary | ICD-10-CM | POA: Diagnosis not present

## 2018-02-11 DIAGNOSIS — E039 Hypothyroidism, unspecified: Secondary | ICD-10-CM | POA: Diagnosis not present

## 2018-02-11 DIAGNOSIS — E782 Mixed hyperlipidemia: Secondary | ICD-10-CM | POA: Diagnosis not present

## 2018-03-05 DIAGNOSIS — E7800 Pure hypercholesterolemia, unspecified: Secondary | ICD-10-CM | POA: Diagnosis not present

## 2018-03-05 DIAGNOSIS — M545 Low back pain: Secondary | ICD-10-CM | POA: Diagnosis not present

## 2018-03-05 DIAGNOSIS — M25561 Pain in right knee: Secondary | ICD-10-CM | POA: Diagnosis not present

## 2018-03-05 DIAGNOSIS — I10 Essential (primary) hypertension: Secondary | ICD-10-CM | POA: Diagnosis not present

## 2018-03-05 DIAGNOSIS — T148XXA Other injury of unspecified body region, initial encounter: Secondary | ICD-10-CM | POA: Diagnosis not present

## 2018-03-05 DIAGNOSIS — E039 Hypothyroidism, unspecified: Secondary | ICD-10-CM | POA: Diagnosis not present

## 2018-03-05 DIAGNOSIS — R7301 Impaired fasting glucose: Secondary | ICD-10-CM | POA: Diagnosis not present

## 2018-03-05 DIAGNOSIS — M5416 Radiculopathy, lumbar region: Secondary | ICD-10-CM | POA: Diagnosis not present

## 2018-05-23 HISTORY — PX: FOOT SURGERY: SHX648

## 2019-01-19 ENCOUNTER — Other Ambulatory Visit: Payer: Self-pay

## 2019-02-22 DIAGNOSIS — E039 Hypothyroidism, unspecified: Secondary | ICD-10-CM | POA: Diagnosis not present

## 2019-02-22 DIAGNOSIS — R7301 Impaired fasting glucose: Secondary | ICD-10-CM | POA: Diagnosis not present

## 2019-02-22 DIAGNOSIS — E7800 Pure hypercholesterolemia, unspecified: Secondary | ICD-10-CM | POA: Diagnosis not present

## 2019-02-22 DIAGNOSIS — I10 Essential (primary) hypertension: Secondary | ICD-10-CM | POA: Diagnosis not present

## 2019-02-22 DIAGNOSIS — N390 Urinary tract infection, site not specified: Secondary | ICD-10-CM | POA: Diagnosis not present

## 2019-03-01 DIAGNOSIS — E782 Mixed hyperlipidemia: Secondary | ICD-10-CM | POA: Diagnosis not present

## 2019-03-01 DIAGNOSIS — E039 Hypothyroidism, unspecified: Secondary | ICD-10-CM | POA: Diagnosis not present

## 2019-03-01 DIAGNOSIS — Z23 Encounter for immunization: Secondary | ICD-10-CM | POA: Diagnosis not present

## 2019-03-01 DIAGNOSIS — Z7189 Other specified counseling: Secondary | ICD-10-CM | POA: Diagnosis not present

## 2019-03-01 DIAGNOSIS — R7301 Impaired fasting glucose: Secondary | ICD-10-CM | POA: Diagnosis not present

## 2019-03-01 DIAGNOSIS — I10 Essential (primary) hypertension: Secondary | ICD-10-CM | POA: Diagnosis not present

## 2019-03-01 DIAGNOSIS — Z0000 Encounter for general adult medical examination without abnormal findings: Secondary | ICD-10-CM | POA: Diagnosis not present

## 2019-04-27 DIAGNOSIS — Z7189 Other specified counseling: Secondary | ICD-10-CM | POA: Diagnosis not present

## 2019-04-27 DIAGNOSIS — R109 Unspecified abdominal pain: Secondary | ICD-10-CM | POA: Diagnosis not present

## 2019-04-27 DIAGNOSIS — R1030 Lower abdominal pain, unspecified: Secondary | ICD-10-CM | POA: Diagnosis not present

## 2019-04-27 DIAGNOSIS — K5792 Diverticulitis of intestine, part unspecified, without perforation or abscess without bleeding: Secondary | ICD-10-CM | POA: Diagnosis not present

## 2019-04-28 ENCOUNTER — Other Ambulatory Visit: Payer: Self-pay | Admitting: Internal Medicine

## 2019-04-28 ENCOUNTER — Other Ambulatory Visit: Payer: Self-pay

## 2019-04-28 ENCOUNTER — Ambulatory Visit
Admission: RE | Admit: 2019-04-28 | Discharge: 2019-04-28 | Disposition: A | Discharge status: Home / Self Care | Payer: PPO | Source: Ambulatory Visit | Attending: Internal Medicine | Admitting: Internal Medicine

## 2019-04-28 DIAGNOSIS — K760 Fatty (change of) liver, not elsewhere classified: Secondary | ICD-10-CM | POA: Diagnosis not present

## 2019-04-28 DIAGNOSIS — R1030 Lower abdominal pain, unspecified: Secondary | ICD-10-CM

## 2019-04-28 DIAGNOSIS — K5730 Diverticulosis of large intestine without perforation or abscess without bleeding: Secondary | ICD-10-CM | POA: Diagnosis not present

## 2019-04-28 DIAGNOSIS — K862 Cyst of pancreas: Secondary | ICD-10-CM | POA: Diagnosis not present

## 2019-04-28 MED ORDER — IOPAMIDOL (ISOVUE-300) INJECTION 61%
100.0000 mL | Freq: Once | INTRAVENOUS | Status: AC | PRN
  Administered 2019-04-28: 100 mL via INTRAVENOUS

## 2019-05-04 DIAGNOSIS — K5792 Diverticulitis of intestine, part unspecified, without perforation or abscess without bleeding: Secondary | ICD-10-CM | POA: Diagnosis not present

## 2019-05-04 DIAGNOSIS — R1030 Lower abdominal pain, unspecified: Secondary | ICD-10-CM | POA: Diagnosis not present

## 2019-08-22 DIAGNOSIS — I10 Essential (primary) hypertension: Secondary | ICD-10-CM | POA: Diagnosis not present

## 2019-08-22 DIAGNOSIS — E7800 Pure hypercholesterolemia, unspecified: Secondary | ICD-10-CM | POA: Diagnosis not present

## 2019-08-29 DIAGNOSIS — R7301 Impaired fasting glucose: Secondary | ICD-10-CM | POA: Diagnosis not present

## 2019-08-29 DIAGNOSIS — E782 Mixed hyperlipidemia: Secondary | ICD-10-CM | POA: Diagnosis not present

## 2019-08-29 DIAGNOSIS — H8110 Benign paroxysmal vertigo, unspecified ear: Secondary | ICD-10-CM | POA: Diagnosis not present

## 2019-08-29 DIAGNOSIS — I10 Essential (primary) hypertension: Secondary | ICD-10-CM | POA: Diagnosis not present

## 2019-10-19 DIAGNOSIS — R109 Unspecified abdominal pain: Secondary | ICD-10-CM | POA: Diagnosis not present

## 2019-10-19 DIAGNOSIS — R197 Diarrhea, unspecified: Secondary | ICD-10-CM | POA: Diagnosis not present

## 2019-10-20 DIAGNOSIS — R197 Diarrhea, unspecified: Secondary | ICD-10-CM | POA: Diagnosis not present

## 2019-10-21 DIAGNOSIS — R197 Diarrhea, unspecified: Secondary | ICD-10-CM | POA: Diagnosis not present

## 2019-10-31 DIAGNOSIS — R109 Unspecified abdominal pain: Secondary | ICD-10-CM | POA: Diagnosis not present

## 2019-10-31 DIAGNOSIS — E876 Hypokalemia: Secondary | ICD-10-CM | POA: Diagnosis not present

## 2019-10-31 DIAGNOSIS — Z86010 Personal history of colonic polyps: Secondary | ICD-10-CM | POA: Diagnosis not present

## 2019-10-31 DIAGNOSIS — D691 Qualitative platelet defects: Secondary | ICD-10-CM | POA: Diagnosis not present

## 2019-10-31 DIAGNOSIS — R197 Diarrhea, unspecified: Secondary | ICD-10-CM | POA: Diagnosis not present

## 2019-11-02 DIAGNOSIS — D691 Qualitative platelet defects: Secondary | ICD-10-CM | POA: Diagnosis not present

## 2019-11-02 DIAGNOSIS — E876 Hypokalemia: Secondary | ICD-10-CM | POA: Diagnosis not present

## 2019-11-17 DIAGNOSIS — R197 Diarrhea, unspecified: Secondary | ICD-10-CM | POA: Diagnosis not present

## 2019-12-08 DIAGNOSIS — E876 Hypokalemia: Secondary | ICD-10-CM | POA: Diagnosis not present

## 2019-12-08 DIAGNOSIS — Z1159 Encounter for screening for other viral diseases: Secondary | ICD-10-CM | POA: Diagnosis not present

## 2019-12-13 DIAGNOSIS — R197 Diarrhea, unspecified: Secondary | ICD-10-CM | POA: Diagnosis not present

## 2019-12-13 DIAGNOSIS — Z86010 Personal history of colonic polyps: Secondary | ICD-10-CM | POA: Diagnosis not present

## 2019-12-13 DIAGNOSIS — K52832 Lymphocytic colitis: Secondary | ICD-10-CM | POA: Diagnosis not present

## 2019-12-13 DIAGNOSIS — D122 Benign neoplasm of ascending colon: Secondary | ICD-10-CM | POA: Diagnosis not present

## 2019-12-13 DIAGNOSIS — K5730 Diverticulosis of large intestine without perforation or abscess without bleeding: Secondary | ICD-10-CM | POA: Diagnosis not present

## 2019-12-16 DIAGNOSIS — D122 Benign neoplasm of ascending colon: Secondary | ICD-10-CM | POA: Diagnosis not present

## 2019-12-16 DIAGNOSIS — K52832 Lymphocytic colitis: Secondary | ICD-10-CM | POA: Diagnosis not present

## 2020-02-02 DIAGNOSIS — H43813 Vitreous degeneration, bilateral: Secondary | ICD-10-CM | POA: Diagnosis not present

## 2020-02-23 DIAGNOSIS — Z86010 Personal history of colonic polyps: Secondary | ICD-10-CM | POA: Diagnosis not present

## 2020-02-23 DIAGNOSIS — K5289 Other specified noninfective gastroenteritis and colitis: Secondary | ICD-10-CM | POA: Diagnosis not present

## 2020-02-28 DIAGNOSIS — R7301 Impaired fasting glucose: Secondary | ICD-10-CM | POA: Diagnosis not present

## 2020-02-28 DIAGNOSIS — I10 Essential (primary) hypertension: Secondary | ICD-10-CM | POA: Diagnosis not present

## 2020-02-28 DIAGNOSIS — E782 Mixed hyperlipidemia: Secondary | ICD-10-CM | POA: Diagnosis not present

## 2020-02-29 DIAGNOSIS — H26492 Other secondary cataract, left eye: Secondary | ICD-10-CM | POA: Diagnosis not present

## 2020-02-29 DIAGNOSIS — I10 Essential (primary) hypertension: Secondary | ICD-10-CM | POA: Diagnosis not present

## 2020-02-29 DIAGNOSIS — Z961 Presence of intraocular lens: Secondary | ICD-10-CM | POA: Diagnosis not present

## 2020-03-06 DIAGNOSIS — Z23 Encounter for immunization: Secondary | ICD-10-CM | POA: Diagnosis not present

## 2020-03-06 DIAGNOSIS — Z0000 Encounter for general adult medical examination without abnormal findings: Secondary | ICD-10-CM | POA: Diagnosis not present

## 2020-03-06 DIAGNOSIS — K862 Cyst of pancreas: Secondary | ICD-10-CM | POA: Diagnosis not present

## 2020-03-06 DIAGNOSIS — I700 Atherosclerosis of aorta: Secondary | ICD-10-CM | POA: Diagnosis not present

## 2020-03-06 DIAGNOSIS — R7301 Impaired fasting glucose: Secondary | ICD-10-CM | POA: Diagnosis not present

## 2020-03-06 DIAGNOSIS — M109 Gout, unspecified: Secondary | ICD-10-CM | POA: Diagnosis not present

## 2020-03-06 DIAGNOSIS — I10 Essential (primary) hypertension: Secondary | ICD-10-CM | POA: Diagnosis not present

## 2020-03-06 DIAGNOSIS — Z789 Other specified health status: Secondary | ICD-10-CM | POA: Diagnosis not present

## 2020-03-06 DIAGNOSIS — E039 Hypothyroidism, unspecified: Secondary | ICD-10-CM | POA: Diagnosis not present

## 2020-03-06 DIAGNOSIS — E782 Mixed hyperlipidemia: Secondary | ICD-10-CM | POA: Diagnosis not present

## 2020-03-07 DIAGNOSIS — Z961 Presence of intraocular lens: Secondary | ICD-10-CM | POA: Diagnosis not present

## 2020-03-19 DIAGNOSIS — H26491 Other secondary cataract, right eye: Secondary | ICD-10-CM | POA: Diagnosis not present

## 2020-03-26 DIAGNOSIS — Z9842 Cataract extraction status, left eye: Secondary | ICD-10-CM | POA: Diagnosis not present

## 2020-03-26 DIAGNOSIS — Z961 Presence of intraocular lens: Secondary | ICD-10-CM | POA: Diagnosis not present

## 2020-06-18 DIAGNOSIS — B9689 Other specified bacterial agents as the cause of diseases classified elsewhere: Secondary | ICD-10-CM | POA: Diagnosis not present

## 2020-06-18 DIAGNOSIS — J019 Acute sinusitis, unspecified: Secondary | ICD-10-CM | POA: Diagnosis not present

## 2020-06-18 DIAGNOSIS — Z20822 Contact with and (suspected) exposure to covid-19: Secondary | ICD-10-CM | POA: Diagnosis not present

## 2020-09-04 DIAGNOSIS — E782 Mixed hyperlipidemia: Secondary | ICD-10-CM | POA: Diagnosis not present

## 2020-09-04 DIAGNOSIS — R7301 Impaired fasting glucose: Secondary | ICD-10-CM | POA: Diagnosis not present

## 2020-09-04 DIAGNOSIS — I10 Essential (primary) hypertension: Secondary | ICD-10-CM | POA: Diagnosis not present

## 2020-09-12 DIAGNOSIS — R7301 Impaired fasting glucose: Secondary | ICD-10-CM | POA: Diagnosis not present

## 2020-09-12 DIAGNOSIS — E039 Hypothyroidism, unspecified: Secondary | ICD-10-CM | POA: Diagnosis not present

## 2020-09-12 DIAGNOSIS — E782 Mixed hyperlipidemia: Secondary | ICD-10-CM | POA: Diagnosis not present

## 2020-09-12 DIAGNOSIS — M109 Gout, unspecified: Secondary | ICD-10-CM | POA: Diagnosis not present

## 2020-09-12 DIAGNOSIS — I10 Essential (primary) hypertension: Secondary | ICD-10-CM | POA: Diagnosis not present

## 2020-09-12 DIAGNOSIS — K862 Cyst of pancreas: Secondary | ICD-10-CM | POA: Diagnosis not present

## 2020-09-12 DIAGNOSIS — Z789 Other specified health status: Secondary | ICD-10-CM | POA: Diagnosis not present

## 2020-09-12 DIAGNOSIS — K52839 Microscopic colitis, unspecified: Secondary | ICD-10-CM | POA: Diagnosis not present

## 2021-03-11 DIAGNOSIS — D696 Thrombocytopenia, unspecified: Secondary | ICD-10-CM | POA: Diagnosis not present

## 2021-03-11 DIAGNOSIS — I10 Essential (primary) hypertension: Secondary | ICD-10-CM | POA: Diagnosis not present

## 2021-03-11 DIAGNOSIS — Z23 Encounter for immunization: Secondary | ICD-10-CM | POA: Diagnosis not present

## 2021-03-11 DIAGNOSIS — Z0000 Encounter for general adult medical examination without abnormal findings: Secondary | ICD-10-CM | POA: Diagnosis not present

## 2021-03-11 DIAGNOSIS — N390 Urinary tract infection, site not specified: Secondary | ICD-10-CM | POA: Diagnosis not present

## 2021-03-11 DIAGNOSIS — E782 Mixed hyperlipidemia: Secondary | ICD-10-CM | POA: Diagnosis not present

## 2021-03-11 DIAGNOSIS — R7301 Impaired fasting glucose: Secondary | ICD-10-CM | POA: Diagnosis not present

## 2021-03-11 DIAGNOSIS — E039 Hypothyroidism, unspecified: Secondary | ICD-10-CM | POA: Diagnosis not present

## 2021-03-19 DIAGNOSIS — D696 Thrombocytopenia, unspecified: Secondary | ICD-10-CM | POA: Diagnosis not present

## 2021-03-19 DIAGNOSIS — K862 Cyst of pancreas: Secondary | ICD-10-CM | POA: Diagnosis not present

## 2021-03-19 DIAGNOSIS — M109 Gout, unspecified: Secondary | ICD-10-CM | POA: Diagnosis not present

## 2021-03-19 DIAGNOSIS — E782 Mixed hyperlipidemia: Secondary | ICD-10-CM | POA: Diagnosis not present

## 2021-03-19 DIAGNOSIS — E039 Hypothyroidism, unspecified: Secondary | ICD-10-CM | POA: Diagnosis not present

## 2021-03-19 DIAGNOSIS — R7301 Impaired fasting glucose: Secondary | ICD-10-CM | POA: Diagnosis not present

## 2021-03-19 DIAGNOSIS — I700 Atherosclerosis of aorta: Secondary | ICD-10-CM | POA: Diagnosis not present

## 2021-03-19 DIAGNOSIS — G720 Drug-induced myopathy: Secondary | ICD-10-CM | POA: Diagnosis not present

## 2021-03-19 DIAGNOSIS — I10 Essential (primary) hypertension: Secondary | ICD-10-CM | POA: Diagnosis not present

## 2021-03-19 DIAGNOSIS — Z0000 Encounter for general adult medical examination without abnormal findings: Secondary | ICD-10-CM | POA: Diagnosis not present

## 2021-03-19 DIAGNOSIS — K52839 Microscopic colitis, unspecified: Secondary | ICD-10-CM | POA: Diagnosis not present

## 2021-05-01 ENCOUNTER — Other Ambulatory Visit: Payer: Self-pay | Admitting: Internal Medicine

## 2021-05-01 DIAGNOSIS — K862 Cyst of pancreas: Secondary | ICD-10-CM

## 2021-05-26 ENCOUNTER — Other Ambulatory Visit: Payer: Self-pay

## 2021-05-26 ENCOUNTER — Ambulatory Visit
Admission: RE | Admit: 2021-05-26 | Discharge: 2021-05-26 | Disposition: A | Discharge status: Home / Self Care | Payer: PPO | Source: Ambulatory Visit | Attending: Internal Medicine | Admitting: Internal Medicine

## 2021-05-26 DIAGNOSIS — K862 Cyst of pancreas: Secondary | ICD-10-CM | POA: Diagnosis not present

## 2021-05-26 DIAGNOSIS — K760 Fatty (change of) liver, not elsewhere classified: Secondary | ICD-10-CM | POA: Diagnosis not present

## 2021-05-26 DIAGNOSIS — Z9049 Acquired absence of other specified parts of digestive tract: Secondary | ICD-10-CM | POA: Diagnosis not present

## 2021-05-26 DIAGNOSIS — K838 Other specified diseases of biliary tract: Secondary | ICD-10-CM | POA: Diagnosis not present

## 2021-05-26 MED ORDER — GADOBENATE DIMEGLUMINE 529 MG/ML IV SOLN
20.0000 mL | Freq: Once | INTRAVENOUS | Status: AC | PRN
  Administered 2021-05-26: 20 mL via INTRAVENOUS

## 2021-06-12 DIAGNOSIS — H43813 Vitreous degeneration, bilateral: Secondary | ICD-10-CM | POA: Diagnosis not present

## 2021-06-21 DIAGNOSIS — M109 Gout, unspecified: Secondary | ICD-10-CM | POA: Diagnosis not present

## 2021-06-21 DIAGNOSIS — I10 Essential (primary) hypertension: Secondary | ICD-10-CM | POA: Diagnosis not present

## 2021-06-21 DIAGNOSIS — E782 Mixed hyperlipidemia: Secondary | ICD-10-CM | POA: Diagnosis not present

## 2021-06-21 DIAGNOSIS — E039 Hypothyroidism, unspecified: Secondary | ICD-10-CM | POA: Diagnosis not present

## 2021-09-30 DIAGNOSIS — I10 Essential (primary) hypertension: Secondary | ICD-10-CM | POA: Diagnosis not present

## 2021-09-30 DIAGNOSIS — R7301 Impaired fasting glucose: Secondary | ICD-10-CM | POA: Diagnosis not present

## 2021-09-30 DIAGNOSIS — E782 Mixed hyperlipidemia: Secondary | ICD-10-CM | POA: Diagnosis not present

## 2021-09-30 DIAGNOSIS — E039 Hypothyroidism, unspecified: Secondary | ICD-10-CM | POA: Diagnosis not present

## 2021-09-30 DIAGNOSIS — D696 Thrombocytopenia, unspecified: Secondary | ICD-10-CM | POA: Diagnosis not present

## 2021-10-07 DIAGNOSIS — G720 Drug-induced myopathy: Secondary | ICD-10-CM | POA: Diagnosis not present

## 2021-10-07 DIAGNOSIS — K52839 Microscopic colitis, unspecified: Secondary | ICD-10-CM | POA: Diagnosis not present

## 2021-10-07 DIAGNOSIS — I700 Atherosclerosis of aorta: Secondary | ICD-10-CM | POA: Diagnosis not present

## 2021-10-07 DIAGNOSIS — E782 Mixed hyperlipidemia: Secondary | ICD-10-CM | POA: Diagnosis not present

## 2021-10-07 DIAGNOSIS — D696 Thrombocytopenia, unspecified: Secondary | ICD-10-CM | POA: Diagnosis not present

## 2021-10-07 DIAGNOSIS — M109 Gout, unspecified: Secondary | ICD-10-CM | POA: Diagnosis not present

## 2021-10-07 DIAGNOSIS — E039 Hypothyroidism, unspecified: Secondary | ICD-10-CM | POA: Diagnosis not present

## 2021-10-07 DIAGNOSIS — R7301 Impaired fasting glucose: Secondary | ICD-10-CM | POA: Diagnosis not present

## 2021-10-07 DIAGNOSIS — I10 Essential (primary) hypertension: Secondary | ICD-10-CM | POA: Diagnosis not present

## 2021-10-07 DIAGNOSIS — K862 Cyst of pancreas: Secondary | ICD-10-CM | POA: Diagnosis not present

## 2021-10-07 DIAGNOSIS — Z23 Encounter for immunization: Secondary | ICD-10-CM | POA: Diagnosis not present

## 2021-12-16 DIAGNOSIS — J0100 Acute maxillary sinusitis, unspecified: Secondary | ICD-10-CM | POA: Diagnosis not present

## 2022-01-21 ENCOUNTER — Encounter: Payer: Self-pay | Admitting: Podiatry

## 2022-01-21 ENCOUNTER — Ambulatory Visit (INDEPENDENT_AMBULATORY_CARE_PROVIDER_SITE_OTHER): Payer: PPO | Admitting: Podiatry

## 2022-01-21 DIAGNOSIS — D2372 Other benign neoplasm of skin of left lower limb, including hip: Secondary | ICD-10-CM

## 2022-01-21 DIAGNOSIS — F5101 Primary insomnia: Secondary | ICD-10-CM | POA: Insufficient documentation

## 2022-01-21 DIAGNOSIS — Z780 Asymptomatic menopausal state: Secondary | ICD-10-CM | POA: Insufficient documentation

## 2022-01-21 DIAGNOSIS — R7301 Impaired fasting glucose: Secondary | ICD-10-CM | POA: Insufficient documentation

## 2022-01-21 DIAGNOSIS — K5792 Diverticulitis of intestine, part unspecified, without perforation or abscess without bleeding: Secondary | ICD-10-CM | POA: Insufficient documentation

## 2022-01-21 DIAGNOSIS — D696 Thrombocytopenia, unspecified: Secondary | ICD-10-CM | POA: Insufficient documentation

## 2022-01-21 DIAGNOSIS — G720 Drug-induced myopathy: Secondary | ICD-10-CM | POA: Insufficient documentation

## 2022-01-21 DIAGNOSIS — I700 Atherosclerosis of aorta: Secondary | ICD-10-CM | POA: Insufficient documentation

## 2022-01-21 DIAGNOSIS — M7752 Other enthesopathy of left foot: Secondary | ICD-10-CM

## 2022-01-21 DIAGNOSIS — K52839 Microscopic colitis, unspecified: Secondary | ICD-10-CM | POA: Insufficient documentation

## 2022-01-21 DIAGNOSIS — Z0000 Encounter for general adult medical examination without abnormal findings: Secondary | ICD-10-CM | POA: Insufficient documentation

## 2022-01-21 DIAGNOSIS — E039 Hypothyroidism, unspecified: Secondary | ICD-10-CM | POA: Insufficient documentation

## 2022-01-21 DIAGNOSIS — R898 Other abnormal findings in specimens from other organs, systems and tissues: Secondary | ICD-10-CM | POA: Insufficient documentation

## 2022-01-21 DIAGNOSIS — I10 Essential (primary) hypertension: Secondary | ICD-10-CM | POA: Insufficient documentation

## 2022-01-21 DIAGNOSIS — M109 Gout, unspecified: Secondary | ICD-10-CM | POA: Insufficient documentation

## 2022-01-21 DIAGNOSIS — E782 Mixed hyperlipidemia: Secondary | ICD-10-CM | POA: Insufficient documentation

## 2022-01-21 MED ORDER — DEXAMETHASONE SODIUM PHOSPHATE 120 MG/30ML IJ SOLN
2.0000 mg | Freq: Once | INTRAMUSCULAR | Status: AC
  Administered 2022-01-21: 2 mg via INTRA_ARTICULAR

## 2022-01-21 NOTE — Progress Notes (Signed)
  Subjective:  Patient ID: Shelly Yates, female    DOB: [birth year],  MRN: [id number] HPI Chief Complaint  Patient presents with   Callouses    Plantar forefoot left - callused areas (multiple), sub 5th MPJ has sharp pains when walking   New Patient (Initial Visit)    Est pt 2019    76 y.o. female presents with the above complaint.   ROS: Denies fever chills nausea vomiting muscle aches pains calf pain back pain chest pain shortness of breath.  Past Medical History:  Diagnosis Date   Hypertension    Past Surgical History:  Procedure Laterality Date   CHOLECYSTECTOMY      Current Outpatient Medications:    aspirin EC 81 MG tablet, Take 81 mg by mouth daily., Disp: , Rfl:    b complex vitamins tablet, Take 1 tablet by mouth daily., Disp: , Rfl:    cholecalciferol (VITAMIN D) 1000 UNITS tablet, Take 4,000 Units by mouth daily., Disp: , Rfl:    co-enzyme Q-10 30 MG capsule, Take 30 mg by mouth daily., Disp: , Rfl:    levothyroxine (SYNTHROID, LEVOTHROID) 50 MCG tablet, Take 50 mcg by mouth every morning., Disp: , Rfl:    triamterene-hydrochlorothiazide (MAXZIDE-25) 37.5-25 MG per tablet, Take 1 tablet by mouth every morning., Disp: , Rfl:   Allergies  Allergen Reactions   Allopurinol     Other reaction(s): did not feel well   Codeine    Naproxen Hives   Simvastatin     Other reaction(s): myalgia   Review of Systems Objective:  There were no vitals filed for this visit.  General: Well developed, nourished, in no acute distress, alert and oriented x3   Dermatological: Skin is warm, dry and supple bilateral. Nails x 10 are well maintained; remaining integument appears unremarkable at this time. There are no open sores, no preulcerative lesions, no rash or signs of infection present.  Painful punctate callus beneath the fifth metatarsal head of the left foot.  She also has a palpable bursa here as well.  Vascular: Dorsalis Pedis artery and Posterior Tibial artery pedal pulses  are 2/4 bilateral with immedate capillary fill time. Pedal hair growth present. No varicosities and no lower extremity edema present bilateral.   Neruologic: Grossly intact via light touch bilateral. Vibratory intact via tuning fork bilateral. Protective threshold with Semmes Wienstein monofilament intact to all pedal sites bilateral. Patellar and Achilles deep tendon reflexes 2+ bilateral. No Babinski or clonus noted bilateral.   Musculoskeletal: No gross boney pedal deformities bilateral. No pain, crepitus, or limitation noted with foot and ankle range of motion bilateral. Muscular strength 5/5 in all groups tested bilateral.  Gait: Unassisted, Nonantalgic.    Radiographs:  None taken  Assessment & Plan:   Assessment: Bursitis of fifth metatarsal head left foot with benign skin lesion.  Plan: Debrided benign skin lesion Sub fifth met head left.  I also injected the bursa today with 2 mg of dexamethasone and local anesthetic.  Follow-up with her as needed.     Aurea Aronov T. [place], [place]

## 2022-01-29 DIAGNOSIS — N939 Abnormal uterine and vaginal bleeding, unspecified: Secondary | ICD-10-CM | POA: Diagnosis not present

## 2022-01-29 DIAGNOSIS — M5441 Lumbago with sciatica, right side: Secondary | ICD-10-CM | POA: Diagnosis not present

## 2022-01-29 DIAGNOSIS — N898 Other specified noninflammatory disorders of vagina: Secondary | ICD-10-CM | POA: Diagnosis not present

## 2022-03-25 DIAGNOSIS — M25561 Pain in right knee: Secondary | ICD-10-CM | POA: Diagnosis not present

## 2022-03-25 DIAGNOSIS — M545 Low back pain, unspecified: Secondary | ICD-10-CM | POA: Diagnosis not present

## 2022-04-02 DIAGNOSIS — Z23 Encounter for immunization: Secondary | ICD-10-CM | POA: Diagnosis not present

## 2022-04-02 DIAGNOSIS — Z0000 Encounter for general adult medical examination without abnormal findings: Secondary | ICD-10-CM | POA: Diagnosis not present

## 2022-04-02 DIAGNOSIS — I10 Essential (primary) hypertension: Secondary | ICD-10-CM | POA: Diagnosis not present

## 2022-04-02 DIAGNOSIS — G720 Drug-induced myopathy: Secondary | ICD-10-CM | POA: Diagnosis not present

## 2022-04-02 DIAGNOSIS — R5383 Other fatigue: Secondary | ICD-10-CM | POA: Diagnosis not present

## 2022-04-02 DIAGNOSIS — R7301 Impaired fasting glucose: Secondary | ICD-10-CM | POA: Diagnosis not present

## 2022-04-02 DIAGNOSIS — E782 Mixed hyperlipidemia: Secondary | ICD-10-CM | POA: Diagnosis not present

## 2022-04-02 DIAGNOSIS — I700 Atherosclerosis of aorta: Secondary | ICD-10-CM | POA: Diagnosis not present

## 2022-04-03 DIAGNOSIS — M545 Low back pain, unspecified: Secondary | ICD-10-CM | POA: Diagnosis not present

## 2022-04-07 DIAGNOSIS — D696 Thrombocytopenia, unspecified: Secondary | ICD-10-CM | POA: Diagnosis not present

## 2022-04-07 DIAGNOSIS — E039 Hypothyroidism, unspecified: Secondary | ICD-10-CM | POA: Diagnosis not present

## 2022-04-07 DIAGNOSIS — Z0000 Encounter for general adult medical examination without abnormal findings: Secondary | ICD-10-CM | POA: Diagnosis not present

## 2022-04-07 DIAGNOSIS — K52839 Microscopic colitis, unspecified: Secondary | ICD-10-CM | POA: Diagnosis not present

## 2022-04-07 DIAGNOSIS — E782 Mixed hyperlipidemia: Secondary | ICD-10-CM | POA: Diagnosis not present

## 2022-04-07 DIAGNOSIS — R7301 Impaired fasting glucose: Secondary | ICD-10-CM | POA: Diagnosis not present

## 2022-04-07 DIAGNOSIS — G720 Drug-induced myopathy: Secondary | ICD-10-CM | POA: Diagnosis not present

## 2022-04-07 DIAGNOSIS — M109 Gout, unspecified: Secondary | ICD-10-CM | POA: Diagnosis not present

## 2022-04-07 DIAGNOSIS — I700 Atherosclerosis of aorta: Secondary | ICD-10-CM | POA: Diagnosis not present

## 2022-04-07 DIAGNOSIS — I10 Essential (primary) hypertension: Secondary | ICD-10-CM | POA: Diagnosis not present

## 2022-04-16 DIAGNOSIS — M25561 Pain in right knee: Secondary | ICD-10-CM | POA: Diagnosis not present

## 2022-04-16 DIAGNOSIS — M25551 Pain in right hip: Secondary | ICD-10-CM | POA: Diagnosis not present

## 2022-04-22 DIAGNOSIS — R102 Pelvic and perineal pain: Secondary | ICD-10-CM | POA: Diagnosis not present

## 2022-04-25 DIAGNOSIS — M25561 Pain in right knee: Secondary | ICD-10-CM | POA: Diagnosis not present

## 2022-04-25 DIAGNOSIS — M25551 Pain in right hip: Secondary | ICD-10-CM | POA: Diagnosis not present

## 2022-05-02 DIAGNOSIS — M25551 Pain in right hip: Secondary | ICD-10-CM | POA: Diagnosis not present

## 2022-06-11 DIAGNOSIS — M25561 Pain in right knee: Secondary | ICD-10-CM | POA: Diagnosis not present

## 2022-06-11 DIAGNOSIS — M25551 Pain in right hip: Secondary | ICD-10-CM | POA: Diagnosis not present

## 2022-07-28 DIAGNOSIS — M25551 Pain in right hip: Secondary | ICD-10-CM | POA: Diagnosis not present

## 2022-07-28 DIAGNOSIS — M25561 Pain in right knee: Secondary | ICD-10-CM | POA: Diagnosis not present

## 2022-10-29 DIAGNOSIS — D696 Thrombocytopenia, unspecified: Secondary | ICD-10-CM | POA: Diagnosis not present

## 2022-10-29 DIAGNOSIS — E782 Mixed hyperlipidemia: Secondary | ICD-10-CM | POA: Diagnosis not present

## 2022-10-29 DIAGNOSIS — R7301 Impaired fasting glucose: Secondary | ICD-10-CM | POA: Diagnosis not present

## 2022-11-05 DIAGNOSIS — E782 Mixed hyperlipidemia: Secondary | ICD-10-CM | POA: Diagnosis not present

## 2022-11-05 DIAGNOSIS — I700 Atherosclerosis of aorta: Secondary | ICD-10-CM | POA: Diagnosis not present

## 2022-11-05 DIAGNOSIS — K52839 Microscopic colitis, unspecified: Secondary | ICD-10-CM | POA: Diagnosis not present

## 2022-11-05 DIAGNOSIS — I10 Essential (primary) hypertension: Secondary | ICD-10-CM | POA: Diagnosis not present

## 2022-11-05 DIAGNOSIS — M109 Gout, unspecified: Secondary | ICD-10-CM | POA: Diagnosis not present

## 2022-11-05 DIAGNOSIS — G720 Drug-induced myopathy: Secondary | ICD-10-CM | POA: Diagnosis not present

## 2022-11-05 DIAGNOSIS — R7303 Prediabetes: Secondary | ICD-10-CM | POA: Diagnosis not present

## 2022-11-05 DIAGNOSIS — E039 Hypothyroidism, unspecified: Secondary | ICD-10-CM | POA: Diagnosis not present

## 2022-11-05 DIAGNOSIS — D696 Thrombocytopenia, unspecified: Secondary | ICD-10-CM | POA: Diagnosis not present

## 2022-12-07 DIAGNOSIS — S92514A Nondisplaced fracture of proximal phalanx of right lesser toe(s), initial encounter for closed fracture: Secondary | ICD-10-CM | POA: Diagnosis not present

## 2023-03-16 DIAGNOSIS — M25561 Pain in right knee: Secondary | ICD-10-CM | POA: Diagnosis not present

## 2023-04-10 DIAGNOSIS — M25551 Pain in right hip: Secondary | ICD-10-CM | POA: Diagnosis not present

## 2023-04-22 DIAGNOSIS — R7301 Impaired fasting glucose: Secondary | ICD-10-CM | POA: Diagnosis not present

## 2023-04-22 DIAGNOSIS — D696 Thrombocytopenia, unspecified: Secondary | ICD-10-CM | POA: Diagnosis not present

## 2023-04-22 DIAGNOSIS — M109 Gout, unspecified: Secondary | ICD-10-CM | POA: Diagnosis not present

## 2023-04-22 DIAGNOSIS — E782 Mixed hyperlipidemia: Secondary | ICD-10-CM | POA: Diagnosis not present

## 2023-04-22 DIAGNOSIS — G720 Drug-induced myopathy: Secondary | ICD-10-CM | POA: Diagnosis not present

## 2023-04-22 DIAGNOSIS — K52839 Microscopic colitis, unspecified: Secondary | ICD-10-CM | POA: Diagnosis not present

## 2023-04-22 DIAGNOSIS — I10 Essential (primary) hypertension: Secondary | ICD-10-CM | POA: Diagnosis not present

## 2023-04-22 DIAGNOSIS — I700 Atherosclerosis of aorta: Secondary | ICD-10-CM | POA: Diagnosis not present

## 2023-04-22 DIAGNOSIS — E039 Hypothyroidism, unspecified: Secondary | ICD-10-CM | POA: Diagnosis not present

## 2023-04-22 DIAGNOSIS — Z0000 Encounter for general adult medical examination without abnormal findings: Secondary | ICD-10-CM | POA: Diagnosis not present

## 2023-04-29 DIAGNOSIS — I700 Atherosclerosis of aorta: Secondary | ICD-10-CM | POA: Diagnosis not present

## 2023-04-29 DIAGNOSIS — G720 Drug-induced myopathy: Secondary | ICD-10-CM | POA: Diagnosis not present

## 2023-04-29 DIAGNOSIS — I10 Essential (primary) hypertension: Secondary | ICD-10-CM | POA: Diagnosis not present

## 2023-04-29 DIAGNOSIS — N182 Chronic kidney disease, stage 2 (mild): Secondary | ICD-10-CM | POA: Diagnosis not present

## 2023-04-29 DIAGNOSIS — R399 Unspecified symptoms and signs involving the genitourinary system: Secondary | ICD-10-CM | POA: Diagnosis not present

## 2023-04-29 DIAGNOSIS — Z23 Encounter for immunization: Secondary | ICD-10-CM | POA: Diagnosis not present

## 2023-04-29 DIAGNOSIS — D696 Thrombocytopenia, unspecified: Secondary | ICD-10-CM | POA: Diagnosis not present

## 2023-04-29 DIAGNOSIS — Z0000 Encounter for general adult medical examination without abnormal findings: Secondary | ICD-10-CM | POA: Diagnosis not present

## 2023-04-29 DIAGNOSIS — E782 Mixed hyperlipidemia: Secondary | ICD-10-CM | POA: Diagnosis not present

## 2023-04-29 DIAGNOSIS — K52839 Microscopic colitis, unspecified: Secondary | ICD-10-CM | POA: Diagnosis not present

## 2023-04-29 DIAGNOSIS — E039 Hypothyroidism, unspecified: Secondary | ICD-10-CM | POA: Diagnosis not present

## 2023-05-19 NOTE — Progress Notes (Unsigned)
[facility]  HEMATOLOGY CLINIC CONSULTATION NOTE    PATIENT NAME: Shelly Yates   MR#: [id number] DOB: [birth year]  DATE OF SERVICE: 05/20/2023  REFERRING PHYSICIAN  Georgianne Fick, MD   Patient Care Team: Georgianne Fick, MD as PCP - General (Internal Medicine)   REASON FOR CONSULTATION/ CHIEF COMPLAINT:  Evaluation of thrombocytopenia  HISTORY OF PRESENT ILLNESS:  Shelly Yates is a 77 y.o. lady with a past medical history of hypertension, dyslipidemia, CKD stage 2, hypothyroidism, gout, microscopic colitis, benign hepatic cysts, was referred to our service for evaluation of thrombocytopenia.    Discussed the use of AI scribe software for clinical note transcription with the patient, who gave verbal consent to proceed.   At her primary care provider's office, blood work reviewed from 2013.  Thrombocytopenia noted initially in June 2016 but platelet count normalized thereafter.  Later she was noted to have thrombocytopenia again in September 2021 with a platelet count of 100,000.  Blood work showed finding of clumped platelets.  Recently platelet count was noted to be 64,000 on 04/22/2023.  White count was 8500.  Hemoglobin normal at 14.7.  Hence she was referred to [place] for further evaluation of thrombocytopenia.  The patient was unaware of this issue until recently, although records show a similar occurrence in 2016. The patient reports no symptoms related to low platelets such as bleeding, bruising, blood in stools, gum bleeding, or nose bleeding. She did mention a recent episode of bleeding in the urine, which was resolved with antibiotics. The patient also has a family history of autoimmune conditions, with two daughters diagnosed with Rheumatoid Arthritis (RA), and one of them also has Lupus. She has a history of gout but has not had flare-ups in a while.  She denies fever, cough, diarrhea, or other infectious symptoms.  She denies epistaxis, bloody  stool, melena, hematuria, bruising or other bleeding symptoms. She also denies unintentional weight loss, night sweats or other constitutional symptoms.  MEDICAL HISTORY Past Medical History:  Diagnosis Date   Dyslipidemia    Gout    Hypertension    Microscopic colitis    Prediabetes      SURGICAL HISTORY Past Surgical History:  Procedure Laterality Date   CHOLECYSTECTOMY  2016   FOOT SURGERY  05/2017   FOOT SURGERY  05/2018   TONSILLECTOMY     at age 31     SOCIAL HISTORY: She reports that she has never smoked. She has never used smokeless tobacco. She reports that she does not drink alcohol and does not use drugs. Social History   Socioeconomic History   Marital status: Married    Spouse name: Not on file   Number of children: Not on file   Years of education: Not on file   Highest education level: Not on file  Occupational History   Not on file  Tobacco Use   Smoking status: Never   Smokeless tobacco: Never  Substance and Sexual Activity   Alcohol use: No   Drug use: No   Sexual activity: Not on file  Other Topics Concern   Not on file  Social History Narrative   Not on file   Social Determinants of Health   Financial Resource Strain: Not on file  Food Insecurity: Not on file  Transportation Needs: Not on file  Physical Activity: Not on file  Stress: Not on file  Social Connections: Not on file  Intimate Partner Violence: Not on file    FAMILY  HISTORY: Her family history is not on file.  CURRENT MEDICATIONS   Current Outpatient Medications  Medication Instructions   aspirin EC 81 mg, Daily   b complex vitamins tablet 1 tablet, Oral, Daily   cholecalciferol (VITAMIN D) 4,000 Units, Daily   co-enzyme Q-10 30 mg, Daily   levothyroxine (SYNTHROID) 50 mcg, Every morning   metFORMIN (GLUCOPHAGE-XR) 500 MG 24 hr tablet 1 tablet with evening meal Orally Once a day for 30 days   triamterene-hydrochlorothiazide (MAXZIDE-25) 37.5-25 MG per tablet 1 tablet,  Every morning     ALLERGIES  She is allergic to allopurinol, codeine, naproxen, and simvastatin.  REVIEW OF SYSTEMS:  Review of Systems - Oncology   Rest of the pertinent review of systems is unremarkable except as mentioned above in HPI.  PHYSICAL EXAMINATION:  ECOG PERFORMANCE STATUS: 1 - Symptomatic but completely ambulatory  Vitals:   05/20/23 0915  BP: 134/60  Pulse: 74  Resp: 18  Temp: (!) 97.2 F (36.2 C)  SpO2: 97%   Filed Weights   05/20/23 0915  Weight: 212 lb 11.2 oz (96.5 kg)    Physical Exam Constitutional:      General: She is not in acute distress.    Appearance: Normal appearance.  HENT:     Head: Normocephalic and atraumatic.  Eyes:     General: No scleral icterus.    Conjunctiva/sclera: Conjunctivae normal.  Cardiovascular:     Rate and Rhythm: Normal rate and regular rhythm.     Heart sounds: Normal heart sounds.  Pulmonary:     Effort: Pulmonary effort is normal.     Breath sounds: Normal breath sounds.  Abdominal:     General: There is no distension.     Palpations: There is no mass.  Musculoskeletal:     Right lower leg: No edema.     Left lower leg: No edema.  Neurological:     General: No focal deficit present.     Mental Status: She is alert and oriented to person, place, and time.  Psychiatric:        Mood and Affect: Mood normal.        Behavior: Behavior normal.        Thought Content: Thought content normal.      LABORATORY DATA:   I have reviewed the data as listed.  Results for orders placed or performed in visit on 05/20/23  Save Smear for Provider Slide Review  Result Value Ref Range   Smear Review SMEAR STAINED AND AVAILABLE FOR REVIEW   Iron and Iron Binding Capacity ([facility] only)  Result Value Ref Range   Iron 81 28 - 170 ug/dL   TIBC 161 096 - 045 ug/dL   Saturation Ratios 23 10.4 - 31.8 %   UIBC 272 148 - 442 ug/dL  Lactate dehydrogenase  Result Value Ref Range   LDH 144 98 - 192 U/L  Comprehensive  metabolic panel  Result Value Ref Range   Sodium 139 135 - 145 mmol/L   Potassium 3.7 3.5 - 5.1 mmol/L   Chloride 101 98 - 111 mmol/L   CO2 32 22 - 32 mmol/L   Glucose, Bld 87 70 - 99 mg/dL   BUN 18 8 - 23 mg/dL   Creatinine, Ser 4.09 0.44 - 1.00 mg/dL   Calcium 9.9 8.9 - 81.1 mg/dL   Total Protein 7.5 6.5 - 8.1 g/dL   Albumin 4.5 3.5 - 5.0 g/dL   AST 15 15 - 41 U/L  ALT 16 0 - 44 U/L   Alkaline Phosphatase 63 38 - 126 U/L   Total Bilirubin 0.6 <1.2 mg/dL   GFR, Estimated >43 >32 mL/min   Anion gap 6 5 - 15  CBC with Differential/Platelet  Result Value Ref Range   WBC See WBC performed on sodium citrate specimen 4.0 - 10.5 K/uL   RBC 5.03 3.87 - 5.11 MIL/uL   Hemoglobin 15.0 12.0 - 15.0 g/dL   HCT 95.1 88.4 - 16.6 %   MCV 87.9 80.0 - 100.0 fL   MCH 29.8 26.0 - 34.0 pg   MCHC 33.9 30.0 - 36.0 g/dL   RDW 06.3 01.6 - 01.0 %   Platelets See PLTC performed on sodium citrate specimen 150 - 400 K/uL   nRBC 0.0 0.0 - 0.2 %   Neutrophils Relative % 64 %   Neutro Abs 5.5 1.7 - 7.7 K/uL   Lymphocytes Relative 26 %   Lymphs Abs 2.2 0.7 - 4.0 K/uL   Monocytes Relative 7 %   Monocytes Absolute 0.6 0.1 - 1.0 K/uL   Eosinophils Relative 1 %   Eosinophils Absolute 0.1 0.0 - 0.5 K/uL   Basophils Relative 1 %   Basophils Absolute 0.0 0.0 - 0.1 K/uL   WBC Morphology MORPHOLOGY UNREMARKABLE    RBC Morphology MORPHOLOGY UNREMARKABLE    Smear Review PLATELET COUNT CONFIRMED BY SMEAR    Immature Granulocytes 1 %   Abs Immature Granulocytes 0.04 0.00 - 0.07 K/uL  WBC/PLT in Citrate  Result Value Ref Range   WBC in Citrate 8.5 4.0 - 10.5 K/uL   Platelet CT in Citrate 223 150 - 400 K/uL       RADIOGRAPHIC STUDIES:  I reviewed MRI abdomen from December 2022.  There was no evidence of pancreatic mass.  Small benign hepatic cysts were noted.  No evidence of hepatosplenomegaly.  ASSESSMENT & PLAN:  77 y.o. lady with a past medical history of hypertension, dyslipidemia, CKD stage 2,  hypothyroidism, gout, microscopic colitis, benign hepatic cysts, was referred to our service for evaluation of thrombocytopenia.    Thrombocytopenia (HCC) Low platelet count noted since 2016, with recent clumping observed in September 2021. Discussed the possibility of pseudothrombocytopenia due to clumping in EDTA anticoagulated tubes, leading to falsely low platelet count. No history of bleeding or bruising.  -We obtained repeat labs today.  There was some occasional platelet clumping noted on EDTA tube but still platelet count was around 160,000.  We obtained platelet count in sodium citrate tube and I reviewed peripheral smear.  Platelet count is at least 220,000, normal.  White count and hemoglobin are also normal.  -Clinical picture is consistent with pseudothrombocytopenia.  -Since platelet count was much lower at her PCPs office, we will rule out other etiologies for thrombocytopenia and hence we will check for autoimmune markers, B12, folic acid, iron levels, and thyroid function.  -Schedule a follow-up phone call on 05/27/2023 at 10:30 AM to discuss results.  -Plan to see patient back in 3 months for follow-up and repeat labs.  If platelet count remains normal, she can be discharged from our office after next visit.  Hypothyroidism Managed with Levothyroxine. -Continue Levothyroxine as prescribed and follow up with PCP for any dose adjustments    Orders Placed This Encounter  Procedures   WBC/PLT in Citrate    Standing Status:   Future    Number of Occurrences:   1    Standing Expiration Date:   05/19/2024   CBC with  Differential/Platelet    Standing Status:   Future    Number of Occurrences:   1    Standing Expiration Date:   05/19/2024   Comprehensive metabolic panel    Standing Status:   Future    Number of Occurrences:   1    Standing Expiration Date:   05/19/2024   Lactate dehydrogenase    Standing Status:   Future    Number of Occurrences:   1    Standing  Expiration Date:   05/19/2024   Ferritin    Standing Status:   Future    Number of Occurrences:   1    Standing Expiration Date:   05/19/2024   Iron and Iron Binding Capacity ([facility] only)    Standing Status:   Future    Number of Occurrences:   1    Standing Expiration Date:   05/19/2024   Vitamin B12    Standing Status:   Future    Number of Occurrences:   1    Standing Expiration Date:   05/19/2024   Folate    Standing Status:   Future    Number of Occurrences:   1    Standing Expiration Date:   05/19/2024   TSH    Standing Status:   Future    Number of Occurrences:   1    Standing Expiration Date:   05/19/2024   ANA w/Reflex if Positive    Standing Status:   Future    Number of Occurrences:   1    Standing Expiration Date:   05/19/2024   Save Smear for Provider Slide Review    Standing Status:   Future    Number of Occurrences:   1    Standing Expiration Date:   05/19/2024    The total time spent in the appointment was 55 minutes encounter with the patient, including review of chart and results of various tests, discussion about plan of care and coordination of care plan.  I reviewed lab results and outside records for this visit and discussed relevant results with the patient. Diagnosis, plan of care and treatment options were also discussed in detail with the patient. Opportunity provided to ask questions and answers provided to her apparent satisfaction. Provided instructions to call our clinic with any problems, questions or concerns prior to return visit. I recommended to continue follow-up with PCP and sub-specialists. She verbalized understanding and agreed with the plan. No barriers to learning was detected.   Future Appointments  Date Time Provider Department Center  08/21/2023 10:30 AM [facility] None  08/21/2023 11:20 AM Farhiya Rosten, Archie Patten, MD [facility] None     Meryl Crutch, MD  05/20/2023 12:04 PM  [facility] - A DEPT OF  Eligha Bridegroom [facility] [address] [place] [address] Dept: [phone] Dept Fax: [phone]    This document was completed utilizing speech recognition software. Grammatical errors, random word insertions, pronoun errors, and incomplete sentences are an occasional consequence of this system due to software limitations, ambient noise, and hardware issues. Any formal questions or concerns about the content, text or information contained within the body of this dictation should be directly addressed to the provider for clarification.

## 2023-05-20 ENCOUNTER — Encounter: Payer: Self-pay | Admitting: Oncology

## 2023-05-20 ENCOUNTER — Inpatient Hospital Stay: Payer: PPO | Attending: Oncology | Admitting: Oncology

## 2023-05-20 ENCOUNTER — Inpatient Hospital Stay: Payer: PPO

## 2023-05-20 VITALS — BP 134/60 | HR 74 | Temp 97.2°F | Resp 18 | Wt 212.7 lb

## 2023-05-20 DIAGNOSIS — E039 Hypothyroidism, unspecified: Secondary | ICD-10-CM

## 2023-05-20 DIAGNOSIS — Z79890 Hormone replacement therapy (postmenopausal): Secondary | ICD-10-CM | POA: Insufficient documentation

## 2023-05-20 DIAGNOSIS — D696 Thrombocytopenia, unspecified: Secondary | ICD-10-CM

## 2023-05-20 LAB — CBC WITH DIFFERENTIAL/PLATELET
Abs Immature Granulocytes: 0.04 10*3/uL (ref 0.00–0.07)
Basophils Absolute: 0 10*3/uL (ref 0.0–0.1)
Basophils Relative: 1 %
Eosinophils Absolute: 0.1 10*3/uL (ref 0.0–0.5)
Eosinophils Relative: 1 %
HCT: 44.2 % (ref 36.0–46.0)
Hemoglobin: 15 g/dL (ref 12.0–15.0)
Immature Granulocytes: 1 %
Lymphocytes Relative: 26 %
Lymphs Abs: 2.2 10*3/uL (ref 0.7–4.0)
MCH: 29.8 pg (ref 26.0–34.0)
MCHC: 33.9 g/dL (ref 30.0–36.0)
MCV: 87.9 fL (ref 80.0–100.0)
Monocytes Absolute: 0.6 10*3/uL (ref 0.1–1.0)
Monocytes Relative: 7 %
Neutro Abs: 5.5 10*3/uL (ref 1.7–7.7)
Neutrophils Relative %: 64 %
RBC: 5.03 MIL/uL (ref 3.87–5.11)
RDW: 13.3 % (ref 11.5–15.5)
nRBC: 0 % (ref 0.0–0.2)

## 2023-05-20 LAB — COMPREHENSIVE METABOLIC PANEL
ALT: 16 U/L (ref 0–44)
AST: 15 U/L (ref 15–41)
Albumin: 4.5 g/dL (ref 3.5–5.0)
Alkaline Phosphatase: 63 U/L (ref 38–126)
Anion gap: 6 (ref 5–15)
BUN: 18 mg/dL (ref 8–23)
CO2: 32 mmol/L (ref 22–32)
Calcium: 9.9 mg/dL (ref 8.9–10.3)
Chloride: 101 mmol/L (ref 98–111)
Creatinine, Ser: 0.77 mg/dL (ref 0.44–1.00)
GFR, Estimated: >60 mL/min (ref >60)
Glucose, Bld: 87 mg/dL (ref 70–99)
Potassium: 3.7 mmol/L (ref 3.5–5.1)
Sodium: 139 mmol/L (ref 135–145)
Total Bilirubin: 0.6 mg/dL (ref <1.2)
Total Protein: 7.5 g/dL (ref 6.5–8.1)

## 2023-05-20 LAB — SAVE SMEAR(SSMR), FOR PROVIDER SLIDE REVIEW

## 2023-05-20 LAB — VITAMIN B12: Vitamin B-12: 211 pg/mL (ref 180–914)

## 2023-05-20 LAB — FERRITIN: Ferritin: 233 ng/mL (ref 11–307)

## 2023-05-20 LAB — FOLATE: Folate: 10 ng/mL (ref >5.9)

## 2023-05-20 LAB — IRON AND IRON BINDING CAPACITY (CC-WL,HP ONLY)
Iron: 81 ug/dL (ref 28–170)
Saturation Ratios: 23 % (ref 10.4–31.8)
TIBC: 353 ug/dL (ref 250–450)
UIBC: 272 ug/dL (ref 148–442)

## 2023-05-20 LAB — WBC/PLT IN CITRATE
Platelet CT in Citrate: 223 10*3/uL (ref 150–400)
WBC in Citrate: 8.5 10*3/uL (ref 4.0–10.5)

## 2023-05-20 LAB — LACTATE DEHYDROGENASE: LDH: 144 U/L (ref 98–192)

## 2023-05-20 LAB — TSH: TSH: 3.164 u[IU]/mL (ref 0.350–4.500)

## 2023-05-20 NOTE — Assessment & Plan Note (Signed)
Managed with Levothyroxine. -Continue Levothyroxine as prescribed and follow up with PCP for any dose adjustments

## 2023-05-20 NOTE — Assessment & Plan Note (Addendum)
Low platelet count noted since 2016, with recent clumping observed in September 2021. Discussed the possibility of pseudothrombocytopenia due to clumping in EDTA anticoagulated tubes, leading to falsely low platelet count. No history of bleeding or bruising.  -We obtained repeat labs today.  There was some occasional platelet clumping noted on EDTA tube but still platelet count was around 160,000.  We obtained platelet count in sodium citrate tube and I reviewed peripheral smear.  Platelet count is at least 220,000, normal.  White count and hemoglobin are also normal.  -Clinical picture is consistent with pseudothrombocytopenia.  -Since platelet count was much lower at her PCPs office, we will rule out other etiologies for thrombocytopenia and hence we will check for autoimmune markers, B12, folic acid, iron levels, and thyroid function.  -Schedule a follow-up phone call on 05/27/2023 at 10:30 AM to discuss results.  -Plan to see patient back in 3 months for follow-up and repeat labs.  If platelet count remains normal, she can be discharged from our office after next visit.

## 2023-05-21 LAB — ENA+DNA/DS+ANTICH+CENTRO+JO...
Anti JO-1: <0.2 AI (ref 0.0–0.9)
Centromere Ab Screen: <0.2 AI (ref 0.0–0.9)
Chromatin Ab SerPl-aCnc: <0.2 AI (ref 0.0–0.9)
ENA SM Ab Ser-aCnc: <0.2 AI (ref 0.0–0.9)
Ribonucleic Protein: <0.2 AI (ref 0.0–0.9)
SSA (Ro) (ENA) Antibody, IgG: >8 AI — ABNORMAL HIGH (ref 0.0–0.9)
SSB (La) (ENA) Antibody, IgG: <0.2 AI (ref 0.0–0.9)
Scleroderma (Scl-70) (ENA) Antibody, IgG: <0.2 AI (ref 0.0–0.9)
ds DNA Ab: 1 [IU]/mL (ref 0–9)

## 2023-05-21 LAB — ANA W/REFLEX IF POSITIVE: Anti Nuclear Antibody (ANA): POSITIVE — AB

## 2023-05-27 ENCOUNTER — Inpatient Hospital Stay: Payer: PPO | Attending: Oncology | Admitting: Oncology

## 2023-05-27 ENCOUNTER — Encounter: Payer: Self-pay | Admitting: Oncology

## 2023-05-27 DIAGNOSIS — R7689 Other specified abnormal immunological findings in serum: Secondary | ICD-10-CM | POA: Insufficient documentation

## 2023-05-27 DIAGNOSIS — E039 Hypothyroidism, unspecified: Secondary | ICD-10-CM | POA: Diagnosis not present

## 2023-05-27 DIAGNOSIS — R768 Other specified abnormal immunological findings in serum: Secondary | ICD-10-CM | POA: Diagnosis not present

## 2023-05-27 DIAGNOSIS — R898 Other abnormal findings in specimens from other organs, systems and tissues: Secondary | ICD-10-CM

## 2023-05-27 NOTE — Assessment & Plan Note (Signed)
Low platelet count noted since 2016, with recent clumping observed in September 2021. No history of bleeding or bruising.  On her initial consultation on 05/20/2023, we obtained repeat labs.  There was some occasional platelet clumping noted on EDTA tube but still platelet count was around 160,000.  We obtained platelet count in sodium citrate tube and I reviewed her peripheral smear.  Platelet count is at least 220,000, normal.  White count and hemoglobin are also normal.   Clinical picture is consistent with pseudothrombocytopenia.  Her platelet count will need to be checked in a sodium citrate tube going forward for accurate measurement.   Since platelet count was much lower at her PCPs office,  to rule out other etiologies for thrombocytopenia, we checked B12, folic acid, iron levels, TSH and they were all within normal limits.   ANA screen came back positive and reflex testing showed elevated anti-Ro IgG antibody >8.0, (normal is 0.0-0.9).  We will send a referral to rheumatology for further evaluation and management.   Plan to see patient back in 3 months for follow-up and repeat labs.  If platelet count remains normal, she can be discharged from our office after next visit.

## 2023-05-27 NOTE — Assessment & Plan Note (Addendum)
-   As part of workup for thrombocytopenia, we obtained ANA screening on her initial consultation with [place] on 05/20/2023.  ANA screen came back positive and reflex testing showed elevated anti-Ro IgG antibody >8.0, (normal is 0.0-0.9).   She does report dry eyes and occasional dry mouth. Has been dealing with chronic arthralgias. We will send a referral to rheumatology for further evaluation and management.

## 2023-05-27 NOTE — Progress Notes (Signed)
[facility]  HEMATOLOGY-ONCOLOGY ELECTRONIC VISIT PROGRESS NOTE  Patient Care Team: Georgianne Fick, MD as PCP - General (Internal Medicine)  I connected with the patient via telephone conference and verified that I am speaking with the correct person using two identifiers. The patient's location is at home and I am providing care from the [facility].  I discussed the limitations, risks, security and privacy concerns of performing an evaluation and management service by e-visits and the availability of in person appointments.  I also discussed with the patient that there may be a patient responsible charge related to this service. The patient expressed understanding and agreed to proceed.   ASSESSMENT & PLAN:   77 y.o. lady with a past medical history of hypertension, dyslipidemia, CKD stage 2, hypothyroidism, gout, microscopic colitis, benign hepatic cysts, was referred to our service in November 2024 for evaluation of thrombocytopenia.  Workup is indicative of pseudothrombocytopenia.  Pseudothrombocytopenia Low platelet count noted since 2016, with recent clumping observed in September 2021. No history of bleeding or bruising.  On her initial consultation on 05/20/2023, we obtained repeat labs.  There was some occasional platelet clumping noted on EDTA tube but still platelet count was around 160,000.  We obtained platelet count in sodium citrate tube and I reviewed her peripheral smear.  Platelet count is at least 220,000, normal.  White count and hemoglobin are also normal.   Clinical picture is consistent with pseudothrombocytopenia.  Her platelet count will need to be checked in a sodium citrate tube going forward for accurate measurement.   Since platelet count was much lower at her PCPs office,  to rule out other etiologies for thrombocytopenia, we checked B12, folic acid, iron levels, TSH and they were all within normal limits.   ANA screen came back  positive and reflex testing showed elevated anti-Ro IgG antibody >8.0, (normal is 0.0-0.9).  We will send a referral to rheumatology for further evaluation and management.   Plan to see patient back in 3 months for follow-up and repeat labs.  If platelet count remains normal, she can be discharged from our office after next visit.  Hypothyroidism Managed with Levothyroxine. -Continue Levothyroxine as prescribed and follow up with PCP for any dose adjustments   ANA positive - As part of workup for thrombocytopenia, we obtained ANA screening on her initial consultation with [place] on 05/20/2023.  ANA screen came back positive and reflex testing showed elevated anti-Ro IgG antibody >8.0, (normal is 0.0-0.9).   She does report dry eyes and occasional dry mouth. Has been dealing with chronic arthralgias. We will send a referral to rheumatology for further evaluation and management.   Orders Placed This Encounter  Procedures   CBC with Differential/Platelet    Standing Status:   Future    Standing Expiration Date:   05/26/2024   WBC/PLT in Citrate    Standing Status:   Future    Standing Expiration Date:   05/26/2024   Ambulatory referral to Rheumatology    Referral Priority:   Routine    Referral Type:   Consultation    Referral Reason:   Specialty Services Required    Requested Specialty:   Rheumatology    Number of Visits Requested:   1    INTERVAL HISTORY:  Please see above for problem oriented charting.  The purpose of today's discussion is to explain recent lab results and to formulate plan of care.  SUMMARY OF HEMATOLOGY HISTORY:  77 y.o. lady with a past  medical history of hypertension, dyslipidemia, CKD stage 2, hypothyroidism, gout, microscopic colitis, benign hepatic cysts, was referred to our service in November 2024 for evaluation of thrombocytopenia.  Workup is indicative of pseudothrombocytopenia.    At her primary care provider's office, blood work reviewed from 2013.   Thrombocytopenia noted initially in June 2016 but platelet count normalized thereafter.  Later she was noted to have thrombocytopenia again in September 2021 with a platelet count of 100,000.  Blood work showed finding of clumped platelets.   Platelet count was noted to be 64,000 on 04/22/2023.  White count was 8500.  Hemoglobin normal at 14.7.  Hence she was referred to [place] for further evaluation of thrombocytopenia.   The patient was unaware of this issue until recently, although records show a similar occurrence in 2016. The patient reports no symptoms related to low platelets such as bleeding, bruising, blood in stools, gum bleeding, or nose bleeding. She did mention a recent episode of bleeding in the urine, which was resolved with antibiotics. The patient also has a family history of autoimmune conditions, with two daughters diagnosed with Rheumatoid Arthritis (RA), and one of them also has Lupus. She has a history of gout but has not had flare-ups in a while.  No history of bleeding or bruising.   On her initial consultation on 05/20/2023, we obtained repeat labs.  There was some occasional platelet clumping noted on EDTA tube but still platelet count was around 160,000.  We obtained platelet count in sodium citrate tube and I reviewed her peripheral smear.  Platelet count is at least 220,000, normal.  White count and hemoglobin are also normal.   Clinical picture is consistent with pseudothrombocytopenia.  Her platelet count will need to be checked in a sodium citrate tube going forward for accurate measurement.   Since platelet count was much lower at her PCPs office,  to rule out other etiologies for thrombocytopenia, we checked B12, folic acid, iron levels, TSH and they were all within normal limits.   ANA screen came back positive and reflex testing showed elevated anti-Ro IgG antibody >8.0, (normal is 0.0-0.9).  We will send a referral to rheumatology for further evaluation and  management.   Plan to see patient back in 3 months for follow-up and repeat labs.  If platelet count remains normal, she can be discharged from our office after next visit.   REVIEW OF SYSTEMS:    Review of Systems - Oncology  All other pertinent systems were reviewed with the patient and are negative.  I have reviewed the past medical history, past surgical history, social history and family history with the patient and they are unchanged from previous note.  ALLERGIES:  She is allergic to allopurinol, codeine, naproxen, and simvastatin.  MEDICATIONS:  Current Outpatient Medications  Medication Sig Dispense Refill   aspirin EC 81 MG tablet Take 81 mg by mouth daily.     b complex vitamins tablet Take 1 tablet by mouth daily.     cholecalciferol (VITAMIN D) 1000 UNITS tablet Take 4,000 Units by mouth daily.     co-enzyme Q-10 30 MG capsule Take 30 mg by mouth daily.     levothyroxine (SYNTHROID, LEVOTHROID) 50 MCG tablet Take 50 mcg by mouth every morning.     metFORMIN (GLUCOPHAGE-XR) 500 MG 24 hr tablet 1 tablet with evening meal Orally Once a day for 30 days     triamterene-hydrochlorothiazide (MAXZIDE-25) 37.5-25 MG per tablet Take 1 tablet by mouth every morning.  No current facility-administered medications for this visit.    PHYSICAL EXAMINATION: ECOG PERFORMANCE STATUS: 1 - Symptomatic but completely ambulatory  LABORATORY DATA:   I have reviewed the data as listed.  Recent Results (from the past 2160 hour(s))  Save Smear for Provider Slide Review     Status: None   Collection Time: 05/20/23 10:09 AM  Result Value Ref Range   Smear Review SMEAR STAINED AND AVAILABLE FOR REVIEW     Comment: Performed at [facility], [address] [place], [place] [address]  ANA w/Reflex if Positive     Status: Abnormal   Collection Time: 05/20/23 10:09 AM  Result Value Ref Range   Anti Nuclear Antibody (ANA) Positive (A) Negative    Comment:  (NOTE) Performed At: [facility] [address] [place], [place] [id number] Jolene Schimke MD [id number]   Vitamin B12     Status: None   Collection Time: 05/20/23 10:09 AM  Result Value Ref Range   Vitamin B-12 211 180 - 914 pg/mL    Comment: (NOTE) This assay is not validated for testing neonatal or myeloproliferative syndrome specimens for Vitamin B12 levels. Performed at [facility], [address] [place], [place] [address]   Iron and Iron Binding Capacity ([facility] only)     Status: None   Collection Time: 05/20/23 10:09 AM  Result Value Ref Range   Iron 81 28 - 170 ug/dL   TIBC 009 381 - 829 ug/dL   Saturation Ratios 23 10.4 - 31.8 %   UIBC 272 148 - 442 ug/dL    Comment: Performed at [facility], [address], [place], [place] [address]  Lactate dehydrogenase     Status: None   Collection Time: 05/20/23 10:09 AM  Result Value Ref Range   LDH 144 98 - 192 U/L    Comment: Performed at [facility], [address] [place], [place] [address]  Comprehensive metabolic panel     Status: None   Collection Time: 05/20/23 10:09 AM  Result Value Ref Range   Sodium 139 135 - 145 mmol/L   Potassium 3.7 3.5 - 5.1 mmol/L   Chloride 101 98 - 111 mmol/L   CO2 32 22 - 32 mmol/L   Glucose, Bld 87 70 - 99 mg/dL    Comment: Glucose reference range applies only to samples taken after fasting for at least 8 hours.   BUN 18 8 - 23 mg/dL   Creatinine, Ser 3.81 0.44 - 1.00 mg/dL   Calcium 9.9 8.9 - 01.7 mg/dL   Total Protein 7.5 6.5 - 8.1 g/dL   Albumin 4.5 3.5 - 5.0 g/dL   AST 15 15 - 41 U/L   ALT 16 0 - 44 U/L   Alkaline Phosphatase 63 38 - 126 U/L   Total Bilirubin 0.6 <1.2 mg/dL   GFR, Estimated >51 >02 mL/min    Comment: (NOTE) Calculated using the CKD-EPI Creatinine Equation (2021)    Anion gap 6 5 - 15    Comment: Performed at [facility], [address],  [place], [place] [address]  CBC with Differential/Platelet     Status: None   Collection Time: 05/20/23 10:09 AM  Result Value Ref Range   WBC See WBC performed on sodium citrate specimen 4.0 - 10.5 K/uL   RBC 5.03 3.87 - 5.11 MIL/uL   Hemoglobin 15.0 12.0 - 15.0 g/dL   HCT 78.2 42.3 - 53.6 %  MCV 87.9 80.0 - 100.0 fL   MCH 29.8 26.0 - 34.0 pg   MCHC 33.9 30.0 - 36.0 g/dL   RDW 95.2 84.1 - 32.4 %   Platelets See PLTC performed on sodium citrate specimen 150 - 400 K/uL   nRBC 0.0 0.0 - 0.2 %   Neutrophils Relative % 64 %   Neutro Abs 5.5 1.7 - 7.7 K/uL   Lymphocytes Relative 26 %   Lymphs Abs 2.2 0.7 - 4.0 K/uL   Monocytes Relative 7 %   Monocytes Absolute 0.6 0.1 - 1.0 K/uL   Eosinophils Relative 1 %   Eosinophils Absolute 0.1 0.0 - 0.5 K/uL   Basophils Relative 1 %   Basophils Absolute 0.0 0.0 - 0.1 K/uL   WBC Morphology MORPHOLOGY UNREMARKABLE    RBC Morphology MORPHOLOGY UNREMARKABLE    Smear Review PLATELET COUNT CONFIRMED BY SMEAR     Comment: LARGE PLATELETS PRESENT   Immature Granulocytes 1 %   Abs Immature Granulocytes 0.04 0.00 - 0.07 K/uL    Comment: Performed at [facility], [address], [place], [place] [address]  WBC/PLT in Citrate     Status: None   Collection Time: 05/20/23 10:09 AM  Result Value Ref Range   WBC in Citrate 8.5 4.0 - 10.5 K/uL   Platelet CT in Citrate 223 150 - 400 K/uL    Comment: (NOTE) This is a modified FDA approved test that has been validated and its performance characteristics determined by the reporting laboratory. This laboratory is certified under the Clinical Laboratory Improvement Amendments (CLIA) as qualified to perform high complexity clinical laboratory testing.  Performed at [facility], [address], [place], [place] [address]   ENA+DNA/DS+ANTICH+CENTRO+JO...     Status: Abnormal   Collection Time: 05/20/23 10:09 AM  Result Value Ref Range   ds DNA Ab 1 0 - 9 IU/mL     Comment: (NOTE)                                   Negative      <5                                   Equivocal  5 - 9                                   Positive      >9    Ribonucleic Protein <0.2 0.0 - 0.9 AI   ENA SM Ab Ser-aCnc <0.2 0.0 - 0.9 AI   Scleroderma (Scl-70) (ENA) Antibody, IgG <0.2 0.0 - 0.9 AI   SSA (Ro) (ENA) Antibody, IgG >8.0 (H) 0.0 - 0.9 AI   SSB (La) (ENA) Antibody, IgG <0.2 0.0 - 0.9 AI   Chromatin Ab SerPl-aCnc <0.2 0.0 - 0.9 AI   Anti JO-1 <0.2 0.0 - 0.9 AI   Centromere Ab Screen <0.2 0.0 - 0.9 AI   See below: Comment     Comment: (NOTE) Autoantibody                       Disease Association ------------------------------------------------------------  Condition                  Frequency ---------------------   ------------------------   --------- Antinuclear Antibody,    SLE, mixed connective Direct (ANA-D)           tissue diseases ---------------------   ------------------------   --------- dsDNA                    SLE                        40 - 60% ---------------------   ------------------------   --------- Chromatin                Drug induced SLE                90%                         SLE                        48 - 97% ---------------------   ------------------------   --------- SSA (Ro)                 SLE                        25 - 35%                         Sjogren's Syndrome         40 - 70%                         Neonatal Lupus                 100% ---------------------   ------------------------   --------- SSB (La)                 SLE                              10%                         Sjogren's Syndrome              30% ---------------------   -----------------------    --------- Sm (anti-Smith)          SLE                        15 - 30% ---------------------   -----------------------    --------- RNP                      Mixed Connective Tissue                         Disease                          95% (U1 nRNP,                SLE                        30 - 50% anti-ribonucleoprotein)  Polymyositis  and/or                         Dermatomyositis                 20% ---------------------   ------------------------   --------- Scl-70 (antiDNA          Scleroderma (diffuse)      20 - 35% topoisomerase)           Crest                           13% ---------------------   ------------------------   --------- Jo-1                     Polymyositis and/or                         Dermatomyositis            20 - 40% ---------------------   ------------------------   --------- Centromere B             Scleroderma -  Crest                         variant                         80% Performed At: [facility] [address] [place], [place] [id number] Jolene Schimke MD [id number]   TSH     Status: None   Collection Time: 05/20/23 10:10 AM  Result Value Ref Range   TSH 3.164 0.350 - 4.500 uIU/mL    Comment: Performed by a 3rd Generation assay with a functional sensitivity of <=0.01 uIU/mL. Performed at [facility], [address], [place], [place] [address]   Folate     Status: None   Collection Time: 05/20/23 10:10 AM  Result Value Ref Range   Folate 10.0 >5.9 ng/mL    Comment: Performed at [facility], [address] [place], [place] [address]  Ferritin     Status: None   Collection Time: 05/20/23 10:11 AM  Result Value Ref Range   Ferritin 233 11 - 307 ng/mL    Comment: Performed at [facility], [address], [place], [place] [address]       Latest Ref Rng & Units 05/20/2023   10:09 AM 07/03/2012    4:31 PM 04/02/2010   10:45 AM  CMP  Glucose 70 - 99 mg/dL 87  629  528   BUN 8 - 23 mg/dL 18  15  20    Creatinine 0.44 - 1.00 mg/dL 4.13  2.44  0.10   Sodium 135 - 145 mmol/L 139  135  137   Potassium 3.5 - 5.1 mmol/L 3.7  3.8  3.7   Chloride 98 - 111 mmol/L 101  96  104   CO2 22 - 32 mmol/L 32  27  27    Calcium 8.9 - 10.3 mg/dL 9.9  27.2  9.1   Total Protein 6.5 - 8.1 g/dL 7.5  7.8  6.9   Total Bilirubin <1.2 mg/dL 0.6  0.5  0.6   Alkaline Phos 38 - 126 U/L 63  77  63   AST 15 - 41 U/L 15  16  19    ALT 0 - 44 U/L 16  15  16     Lab Results  Component Value Date   WBC See WBC performed on sodium citrate specimen 05/20/2023   HGB 15.0 05/20/2023   HCT 44.2 05/20/2023   MCV 87.9 05/20/2023   PLT See PLTC performed on sodium citrate specimen 05/20/2023   NEUTROABS 5.5 05/20/2023     RADIOGRAPHIC STUDIES:  No pertinent imaging studies available to review.  I discussed the assessment and treatment plan with the patient. The patient was provided an opportunity to ask questions and all were answered. The patient agreed with the plan and demonstrated an understanding of the instructions. The patient was advised to call back or seek an in-person evaluation if the symptoms worsen or if the condition fails to improve as anticipated.    I spent 20 minutes for the appointment reviewing test results, discuss management and coordination of care.  Meryl Crutch, MD 05/27/2023 10:44 AM [facility] - A DEPT OF [facility] [address] [place] [address] Dept: [phone] Dept Fax: [phone]   Future Appointments  Date Time Provider Department Center  08/21/2023 10:30 AM [facility] None  08/21/2023 11:20 AM Izaiyah Kleinman, Archie Patten, MD [facility] None    This document was completed utilizing speech recognition software. Grammatical errors, random word insertions, pronoun errors, and incomplete sentences are an occasional consequence of this system due to software limitations, ambient noise, and hardware issues. Any formal questions or concerns about the content, text or information contained within the body of this dictation should be directly addressed to the provider for clarification.

## 2023-05-27 NOTE — Assessment & Plan Note (Signed)
 Managed with Levothyroxine. -Continue Levothyroxine as prescribed and follow up with PCP for any dose adjustments

## 2023-07-29 DIAGNOSIS — R7303 Prediabetes: Secondary | ICD-10-CM | POA: Diagnosis not present

## 2023-07-29 DIAGNOSIS — E039 Hypothyroidism, unspecified: Secondary | ICD-10-CM | POA: Diagnosis not present

## 2023-07-29 DIAGNOSIS — N182 Chronic kidney disease, stage 2 (mild): Secondary | ICD-10-CM | POA: Diagnosis not present

## 2023-07-29 DIAGNOSIS — E782 Mixed hyperlipidemia: Secondary | ICD-10-CM | POA: Diagnosis not present

## 2023-07-29 DIAGNOSIS — I10 Essential (primary) hypertension: Secondary | ICD-10-CM | POA: Diagnosis not present

## 2023-08-05 DIAGNOSIS — M109 Gout, unspecified: Secondary | ICD-10-CM | POA: Diagnosis not present

## 2023-08-05 DIAGNOSIS — E782 Mixed hyperlipidemia: Secondary | ICD-10-CM | POA: Diagnosis not present

## 2023-08-05 DIAGNOSIS — N182 Chronic kidney disease, stage 2 (mild): Secondary | ICD-10-CM | POA: Diagnosis not present

## 2023-08-05 DIAGNOSIS — R7303 Prediabetes: Secondary | ICD-10-CM | POA: Diagnosis not present

## 2023-08-05 DIAGNOSIS — E039 Hypothyroidism, unspecified: Secondary | ICD-10-CM | POA: Diagnosis not present

## 2023-08-05 DIAGNOSIS — D696 Thrombocytopenia, unspecified: Secondary | ICD-10-CM | POA: Diagnosis not present

## 2023-08-05 DIAGNOSIS — G720 Drug-induced myopathy: Secondary | ICD-10-CM | POA: Diagnosis not present

## 2023-08-05 DIAGNOSIS — I10 Essential (primary) hypertension: Secondary | ICD-10-CM | POA: Diagnosis not present

## 2023-08-05 DIAGNOSIS — K52839 Microscopic colitis, unspecified: Secondary | ICD-10-CM | POA: Diagnosis not present

## 2023-08-14 ENCOUNTER — Telehealth: Payer: Self-pay | Admitting: Oncology

## 2023-08-14 NOTE — Telephone Encounter (Signed)
 Transferred patient to the nurse line as she called in to ask why she is being seen.

## 2023-08-21 ENCOUNTER — Encounter: Payer: Self-pay | Admitting: Oncology

## 2023-08-21 ENCOUNTER — Inpatient Hospital Stay: Payer: PPO

## 2023-08-21 ENCOUNTER — Inpatient Hospital Stay: Payer: PPO | Attending: Oncology | Admitting: Oncology

## 2023-08-21 VITALS — BP 139/62 | HR 71 | Temp 97.8°F | Resp 17 | Wt 212.2 lb

## 2023-08-21 DIAGNOSIS — D696 Thrombocytopenia, unspecified: Secondary | ICD-10-CM | POA: Insufficient documentation

## 2023-08-21 DIAGNOSIS — R898 Other abnormal findings in specimens from other organs, systems and tissues: Secondary | ICD-10-CM

## 2023-08-21 DIAGNOSIS — R768 Other specified abnormal immunological findings in serum: Secondary | ICD-10-CM | POA: Diagnosis not present

## 2023-08-21 DIAGNOSIS — Z79899 Other long term (current) drug therapy: Secondary | ICD-10-CM | POA: Diagnosis not present

## 2023-08-21 DIAGNOSIS — E039 Hypothyroidism, unspecified: Secondary | ICD-10-CM | POA: Diagnosis not present

## 2023-08-21 LAB — WBC/PLT IN CITRATE

## 2023-08-21 LAB — CBC WITH DIFFERENTIAL/PLATELET
Abs Immature Granulocytes: 0.04 10*3/uL (ref 0.00–0.07)
Basophils Absolute: 0.1 10*3/uL (ref 0.0–0.1)
Basophils Relative: 1 %
Eosinophils Absolute: 0.2 10*3/uL (ref 0.0–0.5)
Eosinophils Relative: 2 %
HCT: 42.9 % (ref 36.0–46.0)
Hemoglobin: 14.3 g/dL (ref 12.0–15.0)
Immature Granulocytes: 1 %
Lymphocytes Relative: 27 %
Lymphs Abs: 2.3 10*3/uL (ref 0.7–4.0)
MCH: 29 pg (ref 26.0–34.0)
MCHC: 33.3 g/dL (ref 30.0–36.0)
MCV: 87 fL (ref 80.0–100.0)
Monocytes Absolute: 0.7 10*3/uL (ref 0.1–1.0)
Monocytes Relative: 8 %
Neutro Abs: 5.4 10*3/uL (ref 1.7–7.7)
Neutrophils Relative %: 61 %
Platelets: 193 10*3/uL (ref 150–400)
RBC: 4.93 MIL/uL (ref 3.87–5.11)
RDW: 12.6 % (ref 11.5–15.5)
WBC: 8.7 10*3/uL (ref 4.0–10.5)
nRBC: 0 % (ref 0.0–0.2)

## 2023-08-21 NOTE — Assessment & Plan Note (Addendum)
-   As part of workup for thrombocytopenia, we obtained ANA screening on her initial consultation with [place] on 05/20/2023.  ANA screen came back positive and reflex testing showed elevated anti-Ro IgG antibody >8.0, (normal is 0.0-0.9).   She does report dry eyes and occasional dry mouth. Has been dealing with chronic arthralgias. We previously sent a referral to rheumatology for further evaluation and management.  Has appointment for initial consultation with Dr. Dimple Casey in May 2025.

## 2023-08-21 NOTE — Assessment & Plan Note (Signed)
 Managed with Levothyroxine. -Continue Levothyroxine as prescribed and follow up with PCP for any dose adjustments

## 2023-08-21 NOTE — Progress Notes (Signed)
 [facility]  HEMATOLOGY CLINIC PROGRESS NOTE  PATIENT NAME: Shelly Yates   MR#: [id number] DOB: 13-Mar-1946  Patient Care Team: Georgianne Fick, MD as PCP - General (Internal Medicine)  Date of visit: 08/21/2023   ASSESSMENT & PLAN:   Shelly Yates is a 78 y.o. lady with a past medical history of hypertension, dyslipidemia, CKD stage 2, hypothyroidism, gout, microscopic colitis, benign hepatic cysts, was referred to our service in November 2024 for evaluation of thrombocytopenia.  Workup is indicative of pseudothrombocytopenia.   Pseudothrombocytopenia Low platelet count noted since 2016, with recent clumping observed in September 2021. No history of bleeding or bruising.  On her initial consultation on 05/20/2023, we obtained repeat labs.  There was some occasional platelet clumping noted on EDTA tube but still platelet count was around 160,000.  We obtained platelet count in sodium citrate tube and I reviewed her peripheral smear.  Platelet count is at least 220,000, normal.  White count and hemoglobin are also normal.   Clinical picture is consistent with pseudothrombocytopenia.  Her platelet count will need to be checked in a sodium citrate tube going forward for accurate measurement.   Since platelet count was much lower at her PCPs office,  to rule out other etiologies for thrombocytopenia, we checked B12, folic acid, iron levels, TSH and they were all within normal limits.   ANA screen came back positive and reflex testing showed elevated anti-Ro IgG antibody >8.0, (normal is 0.0-0.9).  We previously sent a referral to rheumatology for further evaluation and management.  She has consultation scheduled with Dr. Dimple Casey in May 2025.  Labs today revealed normal platelet count of 193,000.  White count, hemoglobin are also within normal limits.  Since platelet count remains normal and no additional hematological intervention or workup is needed, she can be discharged  from our office for continued follow-up with her PCP and other specialties.  Please reconsult [place] as necessary.  Thank you for giving [place] an opportunity to participate in her care.  ANA positive - As part of workup for thrombocytopenia, we obtained ANA screening on her initial consultation with [place] on 05/20/2023.  ANA screen came back positive and reflex testing showed elevated anti-Ro IgG antibody >8.0, (normal is 0.0-0.9).   She does report dry eyes and occasional dry mouth. Has been dealing with chronic arthralgias. We previously sent a referral to rheumatology for further evaluation and management.  Has appointment for initial consultation with Dr. Dimple Casey in May 2025.  Hypothyroidism Managed with Levothyroxine. -Continue Levothyroxine as prescribed and follow up with PCP for any dose adjustments    I spent a total of 25 minutes during this encounter with the patient including review of chart and various tests results, discussions about plan of care and coordination of care plan.  I reviewed lab results and outside records for this visit and discussed relevant results with the patient. Diagnosis, plan of care and treatment options were also discussed in detail with the patient. Opportunity provided to ask questions and answers provided to her apparent satisfaction. Provided instructions to call our clinic with any problems, questions or concerns prior to return visit. I recommended to continue follow-up with PCP and sub-specialists. She verbalized understanding and agreed with the plan. No barriers to learning was detected.  Meryl Crutch, MD  08/21/2023 12:18 PM  [facility] - A DEPT OF [facility] [facility] [address] [place] [address] Dept:  [phone] Dept Fax: [phone]   CHIEF COMPLAINT/ REASON FOR VISIT:  Follow-up for history of thrombocytopenia.  Workup consistent with pseudothrombocytopenia from platelet  clumping.  INTERVAL HISTORY:  Discussed the use of AI scribe software for clinical note transcription with the patient, who gave verbal consent to proceed.   Patient returns for follow-up.  The patient's platelet count has returned to normal levels, currently at 193,000. The patient was referred to rheumatology due to a positive ANA test and symptoms of dry eyes and joint pain. The patient is scheduled to see rheumatology in May after returning from a trip to [place]. The patient does not report any other significant symptoms at this time.  SUMMARY OF HEMATOLOGIC HISTORY:   lady with a past medical history of hypertension, dyslipidemia, CKD stage 2, hypothyroidism, gout, microscopic colitis, benign hepatic cysts, was referred to our service in November 2024 for evaluation of thrombocytopenia.  Workup is indicative of pseudothrombocytopenia.    At her primary care provider's office, blood work reviewed from 2013.  Thrombocytopenia noted initially in June 2016 but platelet count normalized thereafter.  Later she was noted to have thrombocytopenia again in September 2021 with a platelet count of 100,000.  Blood work showed finding of clumped platelets.   Platelet count was noted to be 64,000 on 04/22/2023.  White count was 8500.  Hemoglobin normal at 14.7.  Hence she was referred to [place] for further evaluation of thrombocytopenia.   The patient was unaware of this issue until recently, although records show a similar occurrence in 2016. The patient reports no symptoms related to low platelets such as bleeding, bruising, blood in stools, gum bleeding, or nose bleeding. She did mention a recent episode of bleeding in the urine, which was resolved with antibiotics. The patient also has a family history of autoimmune conditions, with two daughters diagnosed with Rheumatoid Arthritis (RA), and one of them also has Lupus. She has a history of gout but has not had flare-ups in a while.   No history of  bleeding or bruising.   On her initial consultation on 05/20/2023, we obtained repeat labs.  There was some occasional platelet clumping noted on EDTA tube but still platelet count was around 160,000.  We obtained platelet count in sodium citrate tube and I reviewed her peripheral smear.  Platelet count is at least 220,000, normal.  White count and hemoglobin are also normal.   Clinical picture is consistent with pseudothrombocytopenia.  Her platelet count will need to be checked in a sodium citrate tube going forward for accurate measurement.   Since platelet count was much lower at her PCPs office,  to rule out other etiologies for thrombocytopenia, we checked B12, folic acid, iron levels, TSH and they were all within normal limits.   ANA screen came back positive and reflex testing showed elevated anti-Ro IgG antibody >8.0, (normal is 0.0-0.9).  We previously sent a referral to rheumatology for further evaluation and management.   Since platelet count remains normal, she can be discharged from our office for continued follow-up with her PCP and other specialties.  Please reconsult [place] as necessary.    I have reviewed the past medical history, past surgical history, social history and family history with the patient and they are unchanged from previous note.  ALLERGIES: She is allergic to allopurinol, codeine, naproxen, and simvastatin.  MEDICATIONS:  Current Outpatient Medications  Medication Sig Dispense Refill   aspirin EC 81 MG tablet Take 81 mg by mouth daily.  b complex vitamins tablet Take 1 tablet by mouth daily.     cholecalciferol (VITAMIN D) 1000 UNITS tablet Take 4,000 Units by mouth daily.     co-enzyme Q-10 30 MG capsule Take 30 mg by mouth daily.     levothyroxine (SYNTHROID, LEVOTHROID) 50 MCG tablet Take 50 mcg by mouth every morning.     triamterene-hydrochlorothiazide (MAXZIDE-25) 37.5-25 MG per tablet Take 1 tablet by mouth every morning.     No current  facility-administered medications for this visit.     REVIEW OF SYSTEMS:    ROS  All other pertinent systems were reviewed with the patient and are negative.  PHYSICAL EXAMINATION:   Onc Performance Status - 08/21/23 1144       ECOG Perf Status   ECOG Perf Status Fully active, able to carry on all pre-disease performance without restriction      KPS SCALE   KPS % SCORE Normal, no compliants, no evidence of disease             Vitals:   08/21/23 1130 08/21/23 1153  BP: (!) 151/56 139/62  Pulse: 71   Resp: 17   Temp: 97.8 F (36.6 C)   SpO2: 97%    Filed Weights   08/21/23 1130  Weight: 212 lb 3.2 oz (96.3 kg)    Physical Exam Constitutional:      General: She is not in acute distress.    Appearance: Normal appearance.  HENT:     Head: Normocephalic and atraumatic.  Eyes:     General: No scleral icterus.    Conjunctiva/sclera: Conjunctivae normal.  Cardiovascular:     Rate and Rhythm: Normal rate and regular rhythm.     Heart sounds: Normal heart sounds.  Pulmonary:     Effort: Pulmonary effort is normal.     Breath sounds: Normal breath sounds.  Abdominal:     General: There is no distension.  Musculoskeletal:     Right lower leg: No edema.     Left lower leg: No edema.  Neurological:     General: No focal deficit present.     Mental Status: She is alert and oriented to person, place, and time.  Psychiatric:        Mood and Affect: Mood normal.        Behavior: Behavior normal.        Thought Content: Thought content normal.      LABORATORY DATA:   I have reviewed the data as listed.  Results for orders placed or performed in visit on 08/21/23  WBC/PLT in Citrate  Result Value Ref Range   WBC in Citrate NOT INDICATED 4.0 - 10.5 K/uL   Platelet CT in Citrate EDTA platelet count consistent with citrate. 150 - 400 K/uL  CBC with Differential/Platelet  Result Value Ref Range   WBC 8.7 4.0 - 10.5 K/uL   RBC 4.93 3.87 - 5.11 MIL/uL    Hemoglobin 14.3 12.0 - 15.0 g/dL   HCT 21.3 08.6 - 57.8 %   MCV 87.0 80.0 - 100.0 fL   MCH 29.0 26.0 - 34.0 pg   MCHC 33.3 30.0 - 36.0 g/dL   RDW 46.9 62.9 - 52.8 %   Platelets 193 150 - 400 K/uL   nRBC 0.0 0.0 - 0.2 %   Neutrophils Relative % 61 %   Neutro Abs 5.4 1.7 - 7.7 K/uL   Lymphocytes Relative 27 %   Lymphs Abs 2.3 0.7 - 4.0 K/uL   Monocytes Relative  8 %   Monocytes Absolute 0.7 0.1 - 1.0 K/uL   Eosinophils Relative 2 %   Eosinophils Absolute 0.2 0.0 - 0.5 K/uL   Basophils Relative 1 %   Basophils Absolute 0.1 0.0 - 0.1 K/uL   WBC Morphology MORPHOLOGY UNREMARKABLE    RBC Morphology MORPHOLOGY UNREMARKABLE    Smear Review PLATELET COUNT CONFIRMED BY SMEAR    Immature Granulocytes 1 %   Abs Immature Granulocytes 0.04 0.00 - 0.07 K/uL    RADIOGRAPHIC STUDIES:  No recent pertinent imaging studies available to review.  No orders of the defined types were placed in this encounter.    Future Appointments  Date Time Provider Department Center  08/27/2023  8:45 AM [place] [facility]  10/27/2023  8:00 AM [place], Jamesetta [place], MD [facility] None     This document was completed utilizing speech recognition software. Grammatical errors, random word insertions, pronoun errors, and incomplete sentences are an occasional consequence of this system due to software limitations, ambient noise, and hardware issues. Any formal questions or concerns about the content, text or information contained within the body of this dictation should be directly addressed to the provider for clarification.

## 2023-08-21 NOTE — Assessment & Plan Note (Addendum)
 Low platelet count noted since 2016, with recent clumping observed in September 2021. No history of bleeding or bruising.  On her initial consultation on 05/20/2023, we obtained repeat labs.  There was some occasional platelet clumping noted on EDTA tube but still platelet count was around 160,000.  We obtained platelet count in sodium citrate tube and I reviewed her peripheral smear.  Platelet count is at least 220,000, normal.  White count and hemoglobin are also normal.   Clinical picture is consistent with pseudothrombocytopenia.  Her platelet count will need to be checked in a sodium citrate tube going forward for accurate measurement.   Since platelet count was much lower at her PCPs office,  to rule out other etiologies for thrombocytopenia, we checked B12, folic acid, iron levels, TSH and they were all within normal limits.   ANA screen came back positive and reflex testing showed elevated anti-Ro IgG antibody >8.0, (normal is 0.0-0.9).  We previously sent a referral to rheumatology for further evaluation and management.  She has consultation scheduled with Dr. Dimple Casey in May 2025.  Labs today revealed normal platelet count of 193,000.  White count, hemoglobin are also within normal limits.  Since platelet count remains normal and no additional hematological intervention or workup is needed, she can be discharged from our office for continued follow-up with her PCP and other specialties.  Please reconsult [place] as necessary.  Thank you for giving [place] an opportunity to participate in her care.

## 2023-08-27 ENCOUNTER — Encounter: Payer: Self-pay | Admitting: Podiatry

## 2023-08-27 ENCOUNTER — Ambulatory Visit: Payer: PPO | Admitting: Podiatry

## 2023-08-27 DIAGNOSIS — D691 Qualitative platelet defects: Secondary | ICD-10-CM | POA: Insufficient documentation

## 2023-08-27 DIAGNOSIS — M7752 Other enthesopathy of left foot: Secondary | ICD-10-CM | POA: Diagnosis not present

## 2023-08-27 DIAGNOSIS — R7303 Prediabetes: Secondary | ICD-10-CM | POA: Insufficient documentation

## 2023-08-27 DIAGNOSIS — R197 Diarrhea, unspecified: Secondary | ICD-10-CM | POA: Insufficient documentation

## 2023-08-27 DIAGNOSIS — E876 Hypokalemia: Secondary | ICD-10-CM | POA: Insufficient documentation

## 2023-08-27 DIAGNOSIS — D2372 Other benign neoplasm of skin of left lower limb, including hip: Secondary | ICD-10-CM | POA: Diagnosis not present

## 2023-08-27 DIAGNOSIS — M778 Other enthesopathies, not elsewhere classified: Secondary | ICD-10-CM

## 2023-08-27 DIAGNOSIS — N182 Chronic kidney disease, stage 2 (mild): Secondary | ICD-10-CM | POA: Insufficient documentation

## 2023-08-27 DIAGNOSIS — Z860101 Personal history of adenomatous and serrated colon polyps: Secondary | ICD-10-CM | POA: Insufficient documentation

## 2023-08-27 DIAGNOSIS — R109 Unspecified abdominal pain: Secondary | ICD-10-CM | POA: Insufficient documentation

## 2023-08-27 MED ORDER — DEXAMETHASONE SODIUM PHOSPHATE 120 MG/30ML IJ SOLN
2.0000 mg | Freq: Once | INTRAMUSCULAR | Status: AC
  Administered 2023-08-27: 2 mg via INTRA_ARTICULAR

## 2023-08-27 NOTE — Progress Notes (Signed)
 Shelly Yates presents today for a chief complaint of a painful fifth metatarsal phalangeal joint area of her left foot.  She states that she has a callused area has been tender for several months now states that she has other calluses as well as a bunion deformity on the right foot.  Objective: Vital signs stable alert and oriented x 3.  Pulses are palpable.  There is no erythema edema salines drainage odor she does have fluctuant bursitis on palpation of the fifth metatarsal head plantarly.  There is also an overlying benign skin lesion that is punctated.  Contralateral foot does demonstrate multiple benign skin lesions plantar metatarsal area as well as a hallux valgus and painful rigid hammertoe deformity second right.  Assessment: Bursitis with benign skin lesion subfifth met left.  Multiple benign skin lesions forefoot bilateral.  Plan: Injected dexamethasone and local anesthetic fifth metatarsal head of the left foot into the bursa.  She tolerated the procedure well.  I was able to sharply debride with a chisel blade the nucleus of that benign skin lesion.  I will follow-up with her on an as-needed basis.

## 2023-10-27 ENCOUNTER — Ambulatory Visit: Payer: PPO | Attending: Internal Medicine | Admitting: Internal Medicine

## 2023-10-27 ENCOUNTER — Encounter: Payer: Self-pay | Admitting: Internal Medicine

## 2023-10-27 VITALS — BP 126/75 | HR 70 | Resp 14 | Ht 62.75 in | Wt 209.0 lb

## 2023-10-27 DIAGNOSIS — M25551 Pain in right hip: Secondary | ICD-10-CM

## 2023-10-27 DIAGNOSIS — R768 Other specified abnormal immunological findings in serum: Secondary | ICD-10-CM

## 2023-10-27 MED ORDER — MELOXICAM 7.5 MG PO TABS
7.5000 mg | ORAL_TABLET | Freq: Every day | ORAL | 1 refills | Status: AC
Start: 2023-10-27 — End: ?

## 2023-10-27 NOTE — Patient Instructions (Signed)
 I recommend symptom treatments for eye dryness including lubricating eye drops and can use gel or ointment based products for overnight.  Also consider use of humidifier at night during dry weather.  Use follow-up regularly with your eye doctor.  If symptoms worsen there are several types of medicated eyedrop that can help with the dryness or inflammation.  For chronic dry mouth is important to stay well-hydrated.  You can also use sugar-free gum or lozenges to stimulate the saliva production.  Biotene mouthwash or lozenges may also be helpful.  Products into XyliMelts also work to stimulate saliva production.  Continue following up with your dentist regularly because dryness problems can increase the risk of tooth and gum decay.  If symptoms get worse there are medications for stimulating tear and saliva production but will try all the other options first.

## 2023-10-27 NOTE — Progress Notes (Signed)
 Office Visit Note  Patient: Shelly Yates             Date of Birth: 1945/10/14           MRN: [id number]             PCP: Virgle Grime, MD Referring: Arlo Berber, MD Visit Date: 10/27/2023 Occupation: [employer]  Subjective:  New Patient (Initial Visit) (Patient states she had a positive ANA. Patient states she does have pain in her right hip and right knee. Patient states she has gotten cortisone shots in both the hip and knee at [facility]. )   Discussed the use of AI scribe software for clinical note transcription with the patient, who gave verbal consent to proceed.  History of Present Illness   Shelly Yates is a 78 year old female who presents for evaluation of low platelet counts and positive ANA and SSA antibody tests.  She has experienced low platelet counts, initially concerning but later attributed to clumping, resulting in a false low reading. Her platelet counts have since normalized.  She has a history of arthritis affecting her hips, knees, and spine, with significant discomfort in the right hip and knee. Hip injections are administered approximately every six months, with the last one in January 2025, providing some relief. She experiences significant pain when walking long distances, necessitating frequent rest. Ibuprofen is ineffective for pain management, and she avoids tramadol due to its sedative effects.  She experiences dry eyes, diagnosed by her eye doctor, persisting for four to five years. Artificial tears are used occasionally but are costly. Mild dry mouth symptoms are managed by carrying water. Occasional swelling under her ear, described as a 'numbing type pain,' was noted about a month ago.  She reports a severe charley horse in her leg while in [place], causing nausea, but no swelling or bruising was noted. Hyland's foot cramp tablets are used for occasional foot cramps at night.  She has varicose veins and petechiae on her legs, with the  right leg more affected. Itching and sensitivity occur, particularly after prolonged standing. No major trouble in smaller joints like hands or feet. No recent dental issues or skin rashes.       Activities of Daily Living:  Patient reports morning stiffness for 1 hour.   Patient Reports nocturnal pain.  Difficulty dressing/grooming: Denies Difficulty climbing stairs: Reports Difficulty getting out of chair: Reports Difficulty using hands for taps, buttons, cutlery, and/or writing: Reports  Review of Systems  Constitutional:  Positive for fatigue.  HENT:  Negative for mouth sores and mouth dryness.   Eyes:  Positive for dryness.  Respiratory:  Positive for shortness of breath.   Cardiovascular:  Negative for chest pain and palpitations.  Gastrointestinal:  Negative for blood in stool, constipation and diarrhea.  Endocrine: Negative for increased urination.  Genitourinary:  Negative for involuntary urination.  Musculoskeletal:  Positive for joint pain, joint pain, myalgias, morning stiffness and myalgias. Negative for gait problem, joint swelling, muscle weakness and muscle tenderness.  Skin:  Negative for color change, rash, hair loss and sensitivity to sunlight.  Allergic/Immunologic: Negative for susceptible to infections.  Neurological:  Positive for dizziness. Negative for headaches.  Hematological:  Negative for swollen glands.  Psychiatric/Behavioral:  Positive for sleep disturbance. Negative for depressed mood. The patient is not nervous/anxious.     PMFS History:  Patient Active Problem List   Diagnosis Date Noted   Pain in right hip 10/27/2023   Abdominal pain  08/27/2023   Chronic kidney disease, stage 2 (mild) 08/27/2023   Diarrhea 08/27/2023   History of adenomatous polyp of colon 08/27/2023   Hypokalemia 08/27/2023   Prediabetes 08/27/2023   Qualitative platelet disorder (HCC) 08/27/2023   ANA positive 05/27/2023   Diverticulitis 01/21/2022   Drug-induced  myopathy 01/21/2022   Essential hypertension 01/21/2022   Gout 01/21/2022   Hardening of the aorta (main artery of the heart) (HCC) 01/21/2022   Hypothyroidism 01/21/2022   Impaired fasting glucose 01/21/2022   Encounter for general adult medical examination without abnormal findings 01/21/2022   Menopause 01/21/2022   Microscopic colitis 01/21/2022   Mixed hyperlipidemia 01/21/2022   Morbid obesity (HCC) 01/21/2022   Primary insomnia 01/21/2022   Pseudothrombocytopenia 01/21/2022    Past Medical History:  Diagnosis Date   Dyslipidemia    Gout    Hypertension    Microscopic colitis    Prediabetes     Family History  Problem Relation Age of Onset   Heart Problems Mother    Lung cancer Father    Diabetes Sister    Diabetes Sister    Diabetes Sister    Diabetes Brother    Heart Problems Brother    Lung disease Brother    Diabetes Brother    Past Surgical History:  Procedure Laterality Date   CHOLECYSTECTOMY  2016   CYST EXCISION     FOOT SURGERY  05/2017   FOOT SURGERY  05/2018   OVARIAN CYST SURGERY     TONSILLECTOMY     at age 9   Social History   Social History Narrative   Not on file   Immunization History  Administered Date(s) Administered   DTaP 08/07/2010   Hepatitis B, ADULT 10/27/2006, 12/03/2006, 05/04/2007   Influenza, High Dose Seasonal PF 04/11/2014, 04/30/2017   Influenza, Quadrivalent, Recombinant, Inj, Pf 03/01/2019, 03/06/2020, 03/11/2021, 04/29/2023   Influenza,trivalent, recombinat, inj, PF 04/29/2023   Influenza-Unspecified 03/22/2012, 04/01/2015   PFIZER(Purple Top)SARS-COV-2 Vaccination 08/22/2019   PNEUMOCOCCAL CONJUGATE-20 10/07/2021   Pneumococcal Conjugate-13 06/03/2013   Pneumococcal Polysaccharide-23 03/22/2005, 02/11/2018   Zoster, Live 03/22/2012     Objective: Vital Signs: BP 126/75 (BP Location: Right Arm, Patient Position: Sitting, Cuff Size: Normal)   Pulse 70   Resp 14   Ht 5' 2.75" (1.594 m)   Wt 209 lb (94.8 kg)    BMI 37.32 kg/m    Physical Exam Eyes:     Conjunctiva/sclera: Conjunctivae normal.  Cardiovascular:     Rate and Rhythm: Normal rate and regular rhythm.  Pulmonary:     Effort: Pulmonary effort is normal.     Breath sounds: Normal breath sounds.  Lymphadenopathy:     Cervical: No cervical adenopathy.  Skin:    General: Skin is warm and dry.     Comments: Varicose veins Mild right ankle erythema Petechiae on b/l lower legs Trace pedal edema  Neurological:     Mental Status: She is alert.  Psychiatric:        Mood and Affect: Mood normal.      Musculoskeletal Exam:  Shoulders full ROM no tenderness or swelling Elbows full ROM no tenderness or swelling Wrists full ROM no tenderness or swelling Fingers full ROM no tenderness or swelling Low back tenderness to pressure b/l Lateral hip tenderness Knees full ROM no tenderness or swelling   Investigation: No additional findings.  Imaging: No results found.  Recent Labs: Lab Results  Component Value Date   WBC 8.7 08/21/2023   HGB 14.3 08/21/2023  PLT 193 08/21/2023   NA 139 05/20/2023   K 3.7 05/20/2023   CL 101 05/20/2023   CO2 32 05/20/2023   GLUCOSE 87 05/20/2023   BUN 18 05/20/2023   CREATININE 0.77 05/20/2023   BILITOT 0.6 05/20/2023   ALKPHOS 63 05/20/2023   AST 15 05/20/2023   ALT 16 05/20/2023   PROT 7.5 05/20/2023   ALBUMIN 4.5 05/20/2023   CALCIUM 9.9 05/20/2023   GFRAA >90 07/03/2012    Speciality Comments: No specialty comments available.  Procedures:  No procedures performed Allergies: Allopurinol, Codeine, Naproxen, and Simvastatin   Assessment / Plan:     Visit Diagnoses: ANA positive - Plan: Sedimentation rate, C-reactive protein, C3 and C4, Rheumatoid factor Dry eye syndrome Chronic dry eye syndrome managed with artificial tears. Possible Sjogren's syndrome association. Discussed blood tests for inflammatory markers and potential hydroxychloroquine use if inflammation present.  Overall no particular synovitis or inflammatory appearing skin changes. - Provide information on dry eye symptom management. - Order blood tests for inflammatory markers.  Osteoarthritis Pain in right hip - Plan: meloxicam  (MOBIC ) 7.5 MG tablet Chronic osteoarthritis in hips, knees, and spine, with right hip and knee most symptomatic. Previous ibuprofen ineffective; tramadol avoided. Discussed meloxicam  as an alternative, noting once-daily dosing and difference from naproxen. - Prescribe meloxicam  for pain management. - Consider hip injections for symptom relief as needed.  Chronic venous insufficiency with stasis dermatitis Chronic venous insufficiency with stasis dermatitis, presenting as petechiae and erythema. Symptoms include pruritus and sensitivity due to venous reflux and valve incompetence. Discussed management with compression socks, leg elevation, and dietary modifications. - Recommend wearing compression socks. - Advise elevating feet during prolonged sitting. - Discuss dietary modifications, including salt reduction.       Orders: Orders Placed This Encounter  Procedures   Sedimentation rate   C-reactive protein   C3 and C4   Rheumatoid factor   Meds ordered this encounter  Medications   meloxicam  (MOBIC ) 7.5 MG tablet    Sig: Take 1 tablet (7.5 mg total) by mouth daily.    Dispense:  30 tablet    Refill:  1     Follow-Up Instructions: No follow-ups on file.   Matt Song, MD  Note - This record has been created using [employer].  Chart creation errors have been sought, but may not always  have been located. Such creation errors do not reflect on  the standard of medical care.

## 2023-10-28 LAB — C-REACTIVE PROTEIN: CRP: 13.8 mg/L — ABNORMAL HIGH (ref <8.0)

## 2023-10-28 LAB — SEDIMENTATION RATE: Sed Rate: 17 mm/h (ref 0–30)

## 2023-10-28 LAB — C3 AND C4
C3 Complement: 143 mg/dL (ref 83–193)
C4 Complement: 21 mg/dL (ref 15–57)

## 2023-10-28 LAB — RHEUMATOID FACTOR: Rheumatoid fact SerPl-aCnc: <10 [IU]/mL (ref <14)

## 2023-12-17 ENCOUNTER — Ambulatory Visit: Admitting: Podiatry

## 2023-12-17 ENCOUNTER — Encounter: Payer: Self-pay | Admitting: Podiatry

## 2023-12-17 DIAGNOSIS — M7752 Other enthesopathy of left foot: Secondary | ICD-10-CM | POA: Diagnosis not present

## 2023-12-17 DIAGNOSIS — D2372 Other benign neoplasm of skin of left lower limb, including hip: Secondary | ICD-10-CM | POA: Diagnosis not present

## 2023-12-17 DIAGNOSIS — D2371 Other benign neoplasm of skin of right lower limb, including hip: Secondary | ICD-10-CM

## 2023-12-17 DIAGNOSIS — L600 Ingrowing nail: Secondary | ICD-10-CM

## 2023-12-17 MED ORDER — DEXAMETHASONE SODIUM PHOSPHATE 120 MG/30ML IJ SOLN
2.0000 mg | Freq: Once | INTRAMUSCULAR | Status: AC
  Administered 2023-12-17: 2 mg via INTRA_ARTICULAR

## 2023-12-17 MED ORDER — NEOMYCIN-POLYMYXIN-HC 1 % OT SOLN: OTIC | 1 refills | Status: AC

## 2023-12-17 NOTE — Patient Instructions (Signed)

## 2023-12-18 NOTE — Progress Notes (Signed)
 She presents today for follow-up of her bursitis of her left foot.  Since she is the injection only lasted about 3 months she is also having pain around the 1st and 2nd toes on the right foot.  She states to have corns that are rubbing.  And I think there is an ingrown toenail over here she points to the tibial border of the hallux right.  Objective: Vital signs are stable she is alert and oriented x 3.  Pulses are palpable.  Sharp incurvated nail margin along the tibial border of the right hallux with a mild reactive benign skin lesion lateral aspect of the hallux due to is just to position to the second toe.  She also has a painful area beneath the fifth metatarsal head of the left foot with an overlying benign reactive skin lesion.  This is consistent with bursitis and a porokeratotic lesion.  Assessment: Benign skin lesion subfifth metatarsal head left foot with bursitis.  Painful benign skin lesion hallux right laterally.  Painful ingrown toenail tibial border hallux right.  Plan: Discussed etiology pathology conservative surgical therapies I injected the bursa today with dexamethasone  and local anesthetic.  She tolerated this procedure well after a sterile skin prep.  I also performed a chemical matrixectomy to the tibial border of the hallux right today.  This was performed under local anesthetic was administered the nail border was split from distal to proximal and avulsed exposing the root and the nail bed for 3 applications of phenol 30 seconds each to be applied.  It was then neutralized with isopropyl alcohol Silvadene cream Telfa pad and dressed a compressive dressing was applied we did debride all benign skin lesions as well.  She was given both oral and written home-going instructions for the care and soaking of the foot and a prescription for Cortisporin Otic to be applied twice daily after soaking.  I will follow-up with her in a couple of weeks.

## 2023-12-23 DIAGNOSIS — R7303 Prediabetes: Secondary | ICD-10-CM | POA: Diagnosis not present

## 2023-12-23 DIAGNOSIS — E039 Hypothyroidism, unspecified: Secondary | ICD-10-CM | POA: Diagnosis not present

## 2023-12-23 DIAGNOSIS — E782 Mixed hyperlipidemia: Secondary | ICD-10-CM | POA: Diagnosis not present

## 2023-12-23 DIAGNOSIS — D696 Thrombocytopenia, unspecified: Secondary | ICD-10-CM | POA: Diagnosis not present

## 2023-12-23 DIAGNOSIS — K52839 Microscopic colitis, unspecified: Secondary | ICD-10-CM | POA: Diagnosis not present

## 2023-12-23 DIAGNOSIS — M109 Gout, unspecified: Secondary | ICD-10-CM | POA: Diagnosis not present

## 2023-12-23 DIAGNOSIS — G720 Drug-induced myopathy: Secondary | ICD-10-CM | POA: Diagnosis not present

## 2023-12-23 DIAGNOSIS — I10 Essential (primary) hypertension: Secondary | ICD-10-CM | POA: Diagnosis not present

## 2023-12-23 DIAGNOSIS — N182 Chronic kidney disease, stage 2 (mild): Secondary | ICD-10-CM | POA: Diagnosis not present

## 2023-12-30 DIAGNOSIS — G720 Drug-induced myopathy: Secondary | ICD-10-CM | POA: Diagnosis not present

## 2023-12-30 DIAGNOSIS — M109 Gout, unspecified: Secondary | ICD-10-CM | POA: Diagnosis not present

## 2023-12-30 DIAGNOSIS — N182 Chronic kidney disease, stage 2 (mild): Secondary | ICD-10-CM | POA: Diagnosis not present

## 2023-12-30 DIAGNOSIS — R7303 Prediabetes: Secondary | ICD-10-CM | POA: Diagnosis not present

## 2023-12-30 DIAGNOSIS — K52839 Microscopic colitis, unspecified: Secondary | ICD-10-CM | POA: Diagnosis not present

## 2023-12-30 DIAGNOSIS — E782 Mixed hyperlipidemia: Secondary | ICD-10-CM | POA: Diagnosis not present

## 2023-12-30 DIAGNOSIS — E039 Hypothyroidism, unspecified: Secondary | ICD-10-CM | POA: Diagnosis not present

## 2023-12-30 DIAGNOSIS — D696 Thrombocytopenia, unspecified: Secondary | ICD-10-CM | POA: Diagnosis not present

## 2023-12-30 DIAGNOSIS — I10 Essential (primary) hypertension: Secondary | ICD-10-CM | POA: Diagnosis not present

## 2024-01-12 ENCOUNTER — Ambulatory Visit: Admitting: Podiatry

## 2024-01-12 ENCOUNTER — Encounter: Payer: Self-pay | Admitting: Podiatry

## 2024-01-12 DIAGNOSIS — D2372 Other benign neoplasm of skin of left lower limb, including hip: Secondary | ICD-10-CM | POA: Diagnosis not present

## 2024-01-12 DIAGNOSIS — D2371 Other benign neoplasm of skin of right lower limb, including hip: Secondary | ICD-10-CM

## 2024-01-12 DIAGNOSIS — L600 Ingrowing nail: Secondary | ICD-10-CM

## 2024-01-12 NOTE — Progress Notes (Signed)
 She presents today for a follow-up of her matrixectomy tibial border hallux right.  She also is complaining of painful calluses plantar aspect of the bilateral foot as well as the edema to the left lower extremity.  States that she just flew from [place] and she had a lot of swelling recently.  Objective: Vitals are stable alert and oriented x 3 pulses are palpable pitting edema is noted on the left foot and left medial ankle.  No calf pain.  Multiple benign skin lesions plantar aspect of the forefoot bilateral.  She also has a well-healing surgical toe hallux right no erythema edema salines drainage or odor.  Assessment: Well-healing surgical toe, venous stasis edema.  Multiple benign skin lesions.  Plan: Debridement of multiple skin lesions and discussed compression for the left lower extremity.  Will follow-up with her on an as-needed basis.

## 2024-03-11 DIAGNOSIS — Z86018 Personal history of other benign neoplasm: Secondary | ICD-10-CM | POA: Diagnosis not present

## 2024-03-11 DIAGNOSIS — K5289 Other specified noninfective gastroenteritis and colitis: Secondary | ICD-10-CM | POA: Diagnosis not present

## 2024-05-16 DIAGNOSIS — K52839 Microscopic colitis, unspecified: Secondary | ICD-10-CM | POA: Diagnosis not present

## 2024-05-16 DIAGNOSIS — G720 Drug-induced myopathy: Secondary | ICD-10-CM | POA: Diagnosis not present

## 2024-05-16 DIAGNOSIS — E782 Mixed hyperlipidemia: Secondary | ICD-10-CM | POA: Diagnosis not present

## 2024-05-16 DIAGNOSIS — I10 Essential (primary) hypertension: Secondary | ICD-10-CM | POA: Diagnosis not present

## 2024-05-16 DIAGNOSIS — D696 Thrombocytopenia, unspecified: Secondary | ICD-10-CM | POA: Diagnosis not present

## 2024-05-16 DIAGNOSIS — N182 Chronic kidney disease, stage 2 (mild): Secondary | ICD-10-CM | POA: Diagnosis not present

## 2024-05-16 DIAGNOSIS — E039 Hypothyroidism, unspecified: Secondary | ICD-10-CM | POA: Diagnosis not present

## 2024-05-16 DIAGNOSIS — M109 Gout, unspecified: Secondary | ICD-10-CM | POA: Diagnosis not present

## 2024-05-24 DIAGNOSIS — Z0000 Encounter for general adult medical examination without abnormal findings: Secondary | ICD-10-CM | POA: Diagnosis not present

## 2024-05-24 DIAGNOSIS — Z23 Encounter for immunization: Secondary | ICD-10-CM | POA: Diagnosis not present

## 2024-05-24 DIAGNOSIS — I10 Essential (primary) hypertension: Secondary | ICD-10-CM | POA: Diagnosis not present

## 2024-05-24 DIAGNOSIS — E782 Mixed hyperlipidemia: Secondary | ICD-10-CM | POA: Diagnosis not present

## 2024-05-24 DIAGNOSIS — K52839 Microscopic colitis, unspecified: Secondary | ICD-10-CM | POA: Diagnosis not present

## 2024-05-24 DIAGNOSIS — N182 Chronic kidney disease, stage 2 (mild): Secondary | ICD-10-CM | POA: Diagnosis not present

## 2024-05-24 DIAGNOSIS — M109 Gout, unspecified: Secondary | ICD-10-CM | POA: Diagnosis not present

## 2024-05-24 DIAGNOSIS — G720 Drug-induced myopathy: Secondary | ICD-10-CM | POA: Diagnosis not present

## 2024-05-24 DIAGNOSIS — E039 Hypothyroidism, unspecified: Secondary | ICD-10-CM | POA: Diagnosis not present

## 2024-05-24 DIAGNOSIS — R7303 Prediabetes: Secondary | ICD-10-CM | POA: Diagnosis not present

## 2024-05-24 DIAGNOSIS — D696 Thrombocytopenia, unspecified: Secondary | ICD-10-CM | POA: Diagnosis not present

## 2024-07-23 ENCOUNTER — Emergency Department (HOSPITAL_COMMUNITY)
Admission: EM | Admit: 2024-07-23 | Discharge: 2024-07-23 | Disposition: A | Discharge status: Home / Self Care | Attending: Emergency Medicine | Admitting: Emergency Medicine

## 2024-07-23 ENCOUNTER — Emergency Department (HOSPITAL_COMMUNITY)

## 2024-07-23 DIAGNOSIS — Z7982 Long term (current) use of aspirin: Secondary | ICD-10-CM | POA: Insufficient documentation

## 2024-07-23 DIAGNOSIS — X509XXA Other and unspecified overexertion or strenuous movements or postures, initial encounter: Secondary | ICD-10-CM | POA: Diagnosis not present

## 2024-07-23 DIAGNOSIS — M545 Low back pain, unspecified: Secondary | ICD-10-CM | POA: Diagnosis present

## 2024-07-23 MED ORDER — DICLOFENAC EPOLAMINE 1.3 % EX PTCH: 1.0000 | MEDICATED_PATCH | Freq: Two times a day (BID) | CUTANEOUS | 1 refills | Status: AC

## 2024-07-23 MED ORDER — DICLOFENAC EPOLAMINE 1.3 % EX PTCH
1.0000 | MEDICATED_PATCH | Freq: Two times a day (BID) | CUTANEOUS | Status: DC
  Administered 2024-07-23: 1 via TRANSDERMAL
  Filled 2024-07-23: qty 1

## 2024-07-23 MED ORDER — DEXAMETHASONE SOD PHOSPHATE PF 10 MG/ML IJ SOLN
10.0000 mg | Freq: Once | INTRAMUSCULAR | Status: AC
  Administered 2024-07-23: 10 mg via INTRAMUSCULAR
  Filled 2024-07-23: qty 1

## 2024-07-23 MED ORDER — FENTANYL CITRATE (PF) 50 MCG/ML IJ SOSY
25.0000 ug | PREFILLED_SYRINGE | Freq: Once | INTRAMUSCULAR | Status: AC
  Administered 2024-07-23: 25 ug via INTRAMUSCULAR
  Filled 2024-07-23: qty 1

## 2024-07-23 MED ORDER — PREDNISONE 20 MG PO TABS: 40.0000 mg | ORAL_TABLET | Freq: Every day | ORAL | 0 refills | Status: AC

## 2024-07-23 NOTE — ED Triage Notes (Signed)
 Pt states that yesterday she slipped and almost fell when she was entering her house, injuring her lower back. Pt denies other injury. Pt denies saddle anesthesia, bowel/bladder incontinence.

## 2024-07-23 NOTE — ED Provider Notes (Signed)
 " [place] [facility]MENT AT [facility] Provider Note   CSN: [id number] Arrival date & time: 07/23/24  1251     Patient presents with: Back Pain   Shelly Yates is a 79 y.o. female.   HPI Adult female arrives with her son who assists with the history.  She notes that yesterday, she had a near fall, twisted, and since that time has had severe pain in the lower back, worse with attempts at moving, ambulating. No complete fall, no subsequent development of abdominal pain, incontinence, numbness in either extremity.  No relief with tramadol.    Prior to Admission medications  Medication Sig Start Date End Date Taking? Authorizing Provider  aspirin EC 81 MG tablet Take 81 mg by mouth daily.    [provider]  cholecalciferol (VITAMIN D) 1000 UNITS tablet Take 4,000 Units by mouth daily.    [provider]  co-enzyme Q-10 30 MG capsule Take 30 mg by mouth daily.    [provider]  levothyroxine (SYNTHROID, LEVOTHROID) 50 MCG tablet Take 50 mcg by mouth every morning.    [provider]  meloxicam  (MOBIC ) 7.5 MG tablet Take 1 tablet (7.5 mg total) by mouth daily. 10/27/23   Rice, Lonni LELON, MD  NEOMYCIN -POLYMYXIN-HYDROCORTISONE (CORTISPORIN) 1 % SOLN OTIC solution Apply 1-2 drops to toe BID after soaking 12/17/23   Hyatt, Max T, DPM  triamterene-hydrochlorothiazide (MAXZIDE-25) 37.5-25 MG per tablet Take 1 tablet by mouth every morning.    [provider]  TURMERIC PO Take by mouth.    [provider]    Allergies: Allopurinol, Codeine, Naproxen, and Simvastatin    Review of Systems  Updated Vital Signs BP 126/67 (BP Location: Left Arm)   Pulse 65   Temp 98.1 F (36.7 C) (Oral)   Resp 17   SpO2 96%   Physical Exam Vitals and nursing note reviewed.  Constitutional:      General: She is not in acute distress.    Appearance: She is well-developed. She is obese.  HENT:     Head: Normocephalic and  atraumatic.  Eyes:     Conjunctiva/sclera: Conjunctivae normal.  Cardiovascular:     Rate and Rhythm: Normal rate and regular rhythm.  Pulmonary:     Effort: Pulmonary effort is normal. No respiratory distress.     Breath sounds: Normal breath sounds. No stridor.  Abdominal:     General: There is no distension.  Musculoskeletal:       Arms:  Skin:    General: Skin is warm and dry.  Neurological:     General: No focal deficit present.     Mental Status: She is alert and oriented to person, place, and time.     Cranial Nerves: No cranial nerve deficit.  Psychiatric:        Mood and Affect: Mood normal.     (all labs ordered are listed, but only abnormal results are displayed) Labs Reviewed - No data to display  EKG: None  Radiology: DG Lumbar Spine 2-3 Views Result Date: 07/23/2024 CLINICAL DATA:  Fall, twist, and new lumbar pain. EXAM: LUMBAR SPINE - 2-3 VIEW COMPARISON:  04/28/2019. FINDINGS: There is no evidence of acute lumbar spine fracture. Alignment is normal. There is multilevel intervertebral disc space narrowing, degenerative endplate changes, and facet arthropathy. IMPRESSION: Multilevel degenerative changes without evidence of acute fracture. Electronically Signed   By: Leita Birmingham M.D.   On: 07/23/2024 14:12     Procedures  Medications Ordered in the ED  diclofenac  (FLECTOR ) 1.3 % 1 patch (1 patch Transdermal Patch Applied 07/23/24 1405)  dexamethasone  (DECADRON ) injection 10 mg (10 mg Intramuscular Given 07/23/24 1406)  fentaNYL  (SUBLIMAZE ) injection 25 mcg (25 mcg Intramuscular Given 07/23/24 1407)                                   This patient with a Hx of obesity, advanced age presents to the ED for concern of pain in the lower back following a fall, this involves an extensive number of treatment options, and is a complaint that carries with it a high risk of complications and morbidity.    The differential diagnosis includes musculoskeletal strain,  with concern for osteoporosis, age, fracture, less likely CNS given reassuring neuroexam   Social Determinants of Health:  Age, obesity  Additional history obtained:  Additional history and/or information obtained from son at bedside, notable for corroboration   After the initial evaluation, orders, including: X-ray analgesics were initiated.    On repeat evaluation of the patient improved Patient awake alert sitting on the bed, now pleasantly interactive  Imaging Studies ordered:  I independently visualized and interpreted imaging which showed no acute findings disc disease noted. I agree with the radiologist interpretation   Dispostion / Final MDM:  After consideration of the diagnostic results and the patient's response to treatment, with substantial improvement, no acute findings on x-ray, suspicion for soft tissue injury, disc versus paraspinal muscles, patient discharged in stable condition.    Final diagnoses:  Acute bilateral low back pain without sciatica    ED Discharge Orders     None          Garrick Charleston, MD 07/23/24 1511  "

## 2024-07-23 NOTE — Discharge Instructions (Addendum)
 If you are unable to obtain the diclofenac  patches, [employer] makes a similar product.

## 2024-08-04 ENCOUNTER — Ambulatory Visit: Admitting: Podiatry
